# Patient Record
Sex: Male | Born: 1980 | Race: White | Hispanic: No | Marital: Single | Smoking: Current every day smoker
Health system: Southern US, Community
[De-identification: ages and names within clinical notes are randomized; demographics above are authoritative.]

## PROBLEM LIST (undated history)

## (undated) DIAGNOSIS — F319 Bipolar disorder, unspecified: Secondary | ICD-10-CM

## (undated) HISTORY — PX: HERNIA REPAIR: SHX51

---

## 2004-12-17 ENCOUNTER — Emergency Department: Payer: Self-pay | Admitting: Internal Medicine

## 2006-04-30 ENCOUNTER — Emergency Department: Payer: Self-pay | Admitting: Emergency Medicine

## 2015-08-27 ENCOUNTER — Emergency Department
Admission: EM | Admit: 2015-08-27 | Discharge: 2015-08-28 | Disposition: A | Discharge status: Other Acute Inpatient Hospital | Payer: Self-pay | Attending: Emergency Medicine | Admitting: Emergency Medicine

## 2015-08-27 ENCOUNTER — Encounter: Payer: Self-pay | Admitting: *Deleted

## 2015-08-27 DIAGNOSIS — F321 Major depressive disorder, single episode, moderate: Secondary | ICD-10-CM | POA: Insufficient documentation

## 2015-08-27 DIAGNOSIS — F17210 Nicotine dependence, cigarettes, uncomplicated: Secondary | ICD-10-CM | POA: Insufficient documentation

## 2015-08-27 DIAGNOSIS — F1410 Cocaine abuse, uncomplicated: Secondary | ICD-10-CM | POA: Insufficient documentation

## 2015-08-27 DIAGNOSIS — F1310 Sedative, hypnotic or anxiolytic abuse, uncomplicated: Secondary | ICD-10-CM | POA: Insufficient documentation

## 2015-08-27 LAB — CBC
HCT: 48.9 % (ref 40.0–52.0)
Hemoglobin: 16.7 g/dL (ref 13.0–18.0)
MCH: 31 pg (ref 26.0–34.0)
MCHC: 34.2 g/dL (ref 32.0–36.0)
MCV: 90.8 fL (ref 80.0–100.0)
Platelets: 302 10*3/uL (ref 150–440)
RBC: 5.39 MIL/uL (ref 4.40–5.90)
RDW: 12.9 % (ref 11.5–14.5)
WBC: 10.1 10*3/uL (ref 3.8–10.6)

## 2015-08-27 LAB — URINE DRUG SCREEN, QUALITATIVE (ARMC ONLY)
Amphetamines, Ur Screen: NOT DETECTED
BARBITURATES, UR SCREEN: NOT DETECTED
BENZODIAZEPINE, UR SCRN: POSITIVE — AB
Cannabinoid 50 Ng, Ur ~~LOC~~: NOT DETECTED
Cocaine Metabolite,Ur ~~LOC~~: POSITIVE — AB
MDMA (Ecstasy)Ur Screen: NOT DETECTED
METHADONE SCREEN, URINE: NOT DETECTED
Opiate, Ur Screen: NOT DETECTED
Phencyclidine (PCP) Ur S: NOT DETECTED
TRICYCLIC, UR SCREEN: NOT DETECTED

## 2015-08-27 LAB — ETHANOL: Alcohol, Ethyl (B): 31 mg/dL — ABNORMAL HIGH (ref <5)

## 2015-08-27 LAB — COMPREHENSIVE METABOLIC PANEL
ALK PHOS: 47 U/L (ref 38–126)
ALT: 15 U/L — ABNORMAL LOW (ref 17–63)
ANION GAP: 9 (ref 5–15)
AST: 21 U/L (ref 15–41)
Albumin: 4.4 g/dL (ref 3.5–5.0)
BILIRUBIN TOTAL: 0.5 mg/dL (ref 0.3–1.2)
BUN: 14 mg/dL (ref 6–20)
CALCIUM: 9.5 mg/dL (ref 8.9–10.3)
CO2: 27 mmol/L (ref 22–32)
Chloride: 105 mmol/L (ref 101–111)
Creatinine, Ser: 1.14 mg/dL (ref 0.61–1.24)
GFR calc non Af Amer: >60 mL/min (ref >60)
Glucose, Bld: 103 mg/dL — ABNORMAL HIGH (ref 65–99)
Potassium: 4.3 mmol/L (ref 3.5–5.1)
SODIUM: 141 mmol/L (ref 135–145)
TOTAL PROTEIN: 7.7 g/dL (ref 6.5–8.1)

## 2015-08-27 LAB — SALICYLATE LEVEL

## 2015-08-27 LAB — ACETAMINOPHEN LEVEL

## 2015-08-27 NOTE — ED Notes (Signed)
Patient girlfriend called - [phone] for patient while patient was sleeping and stated that she would like the patient to call and leave a voicemail with any updates about his care and progress.

## 2015-08-27 NOTE — ED Notes (Signed)
Report received from Kathaleen Grinderawn T., RN. Pt. Alert and oriented in no distress; SI; denies HI, AVH and pain.  Pt. Instructed to come to me with problems or concerns.Will continue to monitor for safety via security cameras and Q 15 minute checks.

## 2015-08-27 NOTE — ED Notes (Signed)
[facility]  Patient moved to ED BHU #4.

## 2015-08-27 NOTE — ED Notes (Signed)
Pt has seen psychiatrist.  Patient resting quietly in room. No noted distress or abnormal behaviors noted. Will continue 15 minute checks and observation by security camera for safety.

## 2015-08-27 NOTE — ED Notes (Signed)
Patient asleep in room. No noted distress or abnormal behavior. Will continue 15 minute checks and observation by security cameras for safety. 

## 2015-08-27 NOTE — ED Notes (Signed)
Patient resting quietly in room. No noted distress or abnormal behaviors noted. Will continue 15 minute checks and observation by security camera for safety. 

## 2015-08-27 NOTE — ED Notes (Signed)
Pt states a month ago he lost his job. Pt states he feels like he shouldn't be alive. Pt endorses hopelessness. Pt endorses plan to overdose on pills and has means to do so.

## 2015-08-27 NOTE — ED Notes (Signed)
BEHAVIORAL HEALTH ROUNDING Patient sleeping: Yes Patient alert and oriented: na Behavior appropriate: Yes.  ; If no, describe: Nutrition and fluids offered: na Toileting and hygiene offered: na  Sitter present: no Patent examinerLaw enforcement present: Yes  and ODS  ENVIRONMENTAL ASSESSMENT Potentially harmful objects out of patient reach: Yes.   Personal belongings secured: Yes.   Patient dressed in hospital provided attire only: Yes.   Plastic bags out of patient reach: Yes.   Patient care equipment (cords, cables, call bells, lines, and drains) shortened, removed, or accounted for: Yes.   Equipment and supplies removed from bottom of stretcher: Yes.   Potentially toxic materials out of patient reach: Yes.   Sharps container removed or out of patient reach: Yes.     ED BHU PLACEMENT JUSTIFICATION Is the patient under IVC or is there intent for IVC: No. Is the patient medically cleared: Yes.   Is there vacancy in the ED BHU: Yes.   Is the population mix appropriate for patient: Yes.   Is the patient awaiting placement in inpatient or outpatient setting: No. Has the patient had a psychiatric consult: Yes.   Survey of unit performed for contraband, proper placement and condition of furniture, tampering with fixtures in bathroom, shower, and each patient room: Yes.  ; Findings: all clear  APPEARANCE/BEHAVIOR calm, cooperative and adequate rapport can be established NEURO ASSESSMENT Orientation: time, place and person Hallucinations: No.None noted (Hallucinations) Speech: Normal Gait: normal RESPIRATORY ASSESSMENT Normal expansion.  Clear to auscultation.  No rales, rhonchi, or wheezing. CARDIOVASCULAR ASSESSMENT regular rate and rhythm, S1, S2 normal, no murmur, click, rub or gallop GASTROINTESTINAL ASSESSMENT soft, nontender, BS WNL, no r/g EXTREMITIES normal strength, tone, and muscle mass, no deformities, no erythema, induration, or nodules, no evidence of joint effusion, ROM of all  joints is normal, no evidence of joint instability PLAN OF CARE Provide calm/safe environment. Vital signs assessed twice daily. ED BHU Assessment once each 12-hour shift. Collaborate with intake RN daily or as condition indicates. Assure the ED provider has rounded once each shift. Provide and encourage hygiene. Provide redirection as needed. Assess for escalating behavior; address immediately and inform ED provider.  Assess family dynamic and appropriateness for visitation as needed: Yes.  ; If necessary, describe findings: na Educate the patient/family about BHU procedures/visitation: Yes.  ; If necessary, describe findings: na

## 2015-08-27 NOTE — ED Notes (Signed)
Pt given snack tray and drink 

## 2015-08-27 NOTE — ED Notes (Signed)
BEHAVIORAL HEALTH ROUNDING Patient sleeping: Yes.   Patient alert and oriented: not applicable Behavior appropriate: Yes.  ; If no, describe:   Nutrition and fluids offered: No Toileting and hygiene offered: No Sitter present: no Law enforcement present: Yes  and ODS    

## 2015-08-27 NOTE — Consult Note (Signed)
[name] Consult   Reason for Consult:  Follow up Referring Physician:  ER Patient Identification: Jackson Rodriguez MRN:  [id number] Principal Diagnosis: <principal problem not specified> Diagnosis:  There are no active problems to display for this patient.   Total Time spent with patient: 45 minutes  Subjective:   Jackson Rodriguez is a 34 y.o. male patient admitted with 2 month H/O Crack/Cocaine abuse and dependence and has been smoking .Marland Kitchen  HPI:  Pt decided to get help and quit drugs/ Pt lives with girl friend that works at a [employer] along with their 6 yr old child   Past Psychiatric History: No previous H/O Inpt to psychiatry. No H/O suicide attempts but did have thoughts/  Risk to Self: Suicidal Ideation: Yes-Currently Present Suicidal Intent: Yes-Currently Present Is patient at risk for suicide?: Yes Suicidal Plan?: Yes-Currently Present Specify Current Suicidal Plan: Pt planned to OD on drugs Access to Means: Yes Specify Access to Suicidal Means: Pt has access to drugs What has been your use of drugs/alcohol within the last 12 months?: crack cocaine How many times?: 0 Other Self Harm Risks: N/a Triggers for Past Attempts: None known Intentional Self Injurious Behavior: None Risk to Others: Homicidal Ideation: No Thoughts of Harm to Others: No Current Homicidal Intent: No Current Homicidal Plan: No Access to Homicidal Means: No Identified Victim: N/A History of harm to others?: No Assessment of Violence: None Noted Violent Behavior Description: N/A Does patient have access to weapons?: No Criminal Charges Pending?: No Does patient have a court date: No Prior Inpatient Therapy: Prior Inpatient Therapy: No Prior Therapy Dates: N/a Prior Therapy Facilty/Provider(s): N/A Reason for Treatment: N/a Prior Outpatient Therapy: Prior Outpatient Therapy: No Prior Therapy Dates: N/A Prior Therapy Facilty/Provider(s): N/A Reason for Treatment:  N/A Does patient have an ACCT team?: No Does patient have Intensive In-House Services?  : No Does patient have [facility] services? : No Does patient have P4CC services?: No  Past Medical History: History reviewed. No pertinent past medical history. History reviewed. No pertinent past surgical history. Family History: History reviewed. No pertinent family history. Family Psychiatric  History: none Social History:  History  Alcohol Use  . Yes    Comment: 2 - 40 OZ BEERS DAILY     History  Drug Use  . 7.00 per week  . Special: Cocaine    Comment: CRACK, last use w/i past 12 hrs    Social History   Social History  . Marital Status: Single    Spouse Name: N/A  . Number of Children: N/A  . Years of Education: N/A   Social History Main Topics  . Smoking status: Current Every Day Smoker    Types: Cigarettes  . Smokeless tobacco: Never Used  . Alcohol Use: Yes     Comment: 2 - 40 OZ BEERS DAILY  . Drug Use: 7.00 per week    Special: Cocaine     Comment: CRACK, last use w/i past 12 hrs  . Sexual Activity: No   Other Topics Concern  . None   Social History Narrative  . None   Additional Social History:    History of alcohol / drug use?: Yes Name of Substance 1: crack/cocaine 1 - Age of First Use: 34 1 - Amount (size/oz): $60 worth 1 - Frequency: every day 1 - Duration: all day 1 - Last Use / Amount: 08/26/2015  Allergies:  No Known Allergies  Labs:  Results for orders placed or performed during the hospital encounter of 08/27/15 (from the past 48 hour(s))  Comprehensive metabolic panel     Status: Abnormal   Collection Time: 08/27/15  4:07 AM  Result Value Ref Range   Sodium 141 135 - 145 mmol/L   Potassium 4.3 3.5 - 5.1 mmol/L   Chloride 105 101 - 111 mmol/L   CO2 27 22 - 32 mmol/L   Glucose, Bld 103 (H) 65 - 99 mg/dL   BUN 14 6 - 20 mg/dL   Creatinine, Ser 1.14 0.61 - 1.24 mg/dL   Calcium 9.5 8.9 - 10.3 mg/dL   Total Protein 7.7 6.5  - 8.1 g/dL   Albumin 4.4 3.5 - 5.0 g/dL   AST 21 15 - 41 U/L   ALT 15 (L) 17 - 63 U/L   Alkaline Phosphatase 47 38 - 126 U/L   Total Bilirubin 0.5 0.3 - 1.2 mg/dL   GFR calc non Af Amer >60 >60 mL/min   GFR calc Af Amer >60 >60 mL/min    Comment: (NOTE) The eGFR has been calculated using the CKD EPI equation. This calculation has not been validated in all clinical situations. eGFR's persistently <60 mL/min signify possible Chronic Kidney Disease.    Anion gap 9 5 - 15  Ethanol (ETOH)     Status: Abnormal   Collection Time: 08/27/15  4:07 AM  Result Value Ref Range   Alcohol, Ethyl (B) 31 (H) <5 mg/dL    Comment:        LOWEST DETECTABLE LIMIT FOR SERUM ALCOHOL IS 5 mg/dL FOR MEDICAL PURPOSES ONLY   Salicylate level     Status: None   Collection Time: 08/27/15  4:07 AM  Result Value Ref Range   Salicylate Lvl <2.7 2.8 - 30.0 mg/dL  Acetaminophen level     Status: Abnormal   Collection Time: 08/27/15  4:07 AM  Result Value Ref Range   Acetaminophen (Tylenol), Serum <10 (L) 10 - 30 ug/mL    Comment:        THERAPEUTIC CONCENTRATIONS VARY SIGNIFICANTLY. A RANGE OF 10-30 ug/mL MAY BE AN EFFECTIVE CONCENTRATION FOR MANY PATIENTS. HOWEVER, SOME ARE BEST TREATED AT CONCENTRATIONS OUTSIDE THIS RANGE. ACETAMINOPHEN CONCENTRATIONS >150 ug/mL AT 4 HOURS AFTER INGESTION AND >50 ug/mL AT 12 HOURS AFTER INGESTION ARE OFTEN ASSOCIATED WITH TOXIC REACTIONS.   CBC     Status: None   Collection Time: 08/27/15  4:07 AM  Result Value Ref Range   WBC 10.1 3.8 - 10.6 K/uL   RBC 5.39 4.40 - 5.90 MIL/uL   Hemoglobin 16.7 13.0 - 18.0 g/dL   HCT 48.9 40.0 - 52.0 %   MCV 90.8 80.0 - 100.0 fL   MCH 31.0 26.0 - 34.0 pg   MCHC 34.2 32.0 - 36.0 g/dL   RDW 12.9 11.5 - 14.5 %   Platelets 302 150 - 440 K/uL  Urine Drug Screen, Qualitative ([facility] only)     Status: Abnormal   Collection Time: 08/27/15  4:07 AM  Result Value Ref Range   Tricyclic, Ur Screen NONE DETECTED NONE DETECTED    Amphetamines, Ur Screen NONE DETECTED NONE DETECTED   MDMA (Ecstasy)Ur Screen NONE DETECTED NONE DETECTED   Cocaine Metabolite,Ur [place] POSITIVE (A) NONE DETECTED   Opiate, Ur Screen NONE DETECTED NONE DETECTED   Phencyclidine (PCP) Ur S NONE DETECTED NONE DETECTED   Cannabinoid 50 Ng, Ur [place] NONE DETECTED NONE DETECTED  Barbiturates, Ur Screen NONE DETECTED NONE DETECTED   Benzodiazepine, Ur Scrn POSITIVE (A) NONE DETECTED   Methadone Scn, Ur NONE DETECTED NONE DETECTED    Comment: (NOTE) 235  Tricyclics, urine               Cutoff 1000 ng/mL 200  Amphetamines, urine             Cutoff 1000 ng/mL 300  MDMA (Ecstasy), urine           Cutoff 500 ng/mL 400  Cocaine Metabolite, urine       Cutoff 300 ng/mL 500  Opiate, urine                   Cutoff 300 ng/mL 600  Phencyclidine (PCP), urine      Cutoff 25 ng/mL 700  Cannabinoid, urine              Cutoff 50 ng/mL 800  Barbiturates, urine             Cutoff 200 ng/mL 900  Benzodiazepine, urine           Cutoff 200 ng/mL 1000 Methadone, urine                Cutoff 300 ng/mL 1100 1200 The urine drug screen provides only a preliminary, unconfirmed 1300 analytical test result and should not be used for non-medical 1400 purposes. Clinical consideration and professional judgment should 1500 be applied to any positive drug screen result due to possible 1600 interfering substances. A more specific alternate chemical method 1700 must be used in order to obtain a confirmed analytical result.  1800 Gas chromato graphy / mass spectrometry (GC/MS) is the preferred 1900 confirmatory method.     No current facility-administered medications for this encounter.   No current outpatient prescriptions on file.    Musculoskeletal: Strength & Muscle Tone: within normal limits Gait & Station: normal Patient leans: N/A  Psychiatric Specialty Exam: Review of Systems  Constitutional: Negative.   HENT: Negative.   Eyes: Negative.   Respiratory:  Negative.   Cardiovascular: Negative.   Gastrointestinal: Negative.   Genitourinary: Negative.   Musculoskeletal: Negative.   Skin: Negative.   Neurological: Negative.   Endo/Heme/Allergies: Negative.   Psychiatric/Behavioral: Positive for depression and substance abuse. The patient is nervous/anxious.   All other systems reviewed and are negative.   Blood pressure 109/65, pulse 63, temperature 97.8 F (36.6 C), temperature source Oral, resp. rate 18, height $RemoveBe'6\' 1"'pnJUhodkV$  (1.854 m), weight 175 lb (79.379 kg), SpO2 98 %.Body mass index is 23.09 kg/(m^2).  General Appearance: Casual  Eye Contact::  Fair  Speech:  Clear and Coherent  Volume:  Normal  Mood:  Anxious  Affect:  Appropriate  Thought Process:  Circumstantial  Orientation:  Full (Time, Place, and Person)  Thought Content:  NA  Suicidal Thoughts:  Yes.  without intent/plan  Homicidal Thoughts:  No  Memory:  Immediate;   Fair Recent;   Fair Remote;   Fair adequate  Judgement:  Fair  Insight:  Fair  Psychomotor Activity:  Normal  Concentration:  Fair  Recall:  [employer] [place]  Language: Fair  Akathisia:  No  Handed:  Right  AIMS (if indicated):     Assets:  Communication Skills Desire for [place] Talents/Skills  ADL's:  Intact  Cognition: WNL  Sleep:      Treatment Plan Summary: Plan Will continue observation for withdrawl and will consider D/C tomorrow evening with  follow up with [facility] for Out pt substance abuse program on Monday 08/29/2015  Disposition: No evidence of imminent risk to self or others at present.    Dewain Penning 08/27/2015 4:27 PM

## 2015-08-27 NOTE — ED Notes (Signed)
Pt. To ED-BHU from ED ambulatory without difficulty, to room  . Report from RN. Pt. Is alert and oriented, warm and dry in no distress. Pt. Verbalizes having SI; denies having HI, and AVH. Pt. Calm and cooperative. Pt. States, "I gotta stop; the drugs are out of hand I spent up all of the money; Christmas money; all of it." Pt. Made aware of security cameras and Q15 minute rounds. Pt. Encouraged to let Nursing staff know of any concerns or needs.

## 2015-08-27 NOTE — ED Notes (Signed)
Patient aware that he will be discharged tomorrow.  Patient resting quietly in room. No noted distress or abnormal behaviors noted. Will continue 15 minute checks and observation by security camera for safety.

## 2015-08-27 NOTE — ED Notes (Signed)
Patient resting quietly in room. No noted distress or abnormal behaviors noted. Will continue 15 minute checks and observation by security camera for safety. 

## 2015-08-27 NOTE — ED Notes (Signed)
Pt. Noted in room resting quietly;. No complaints or concerns voiced. No distress or abnormal behavior noted. Will continue to monitor with security cameras. Q 15 minute rounds continue. 

## 2015-08-27 NOTE — ED Notes (Signed)
BEHAVIORAL HEALTH ROUNDING Patient sleeping: No. Patient alert and oriented: yes Behavior appropriate: Yes.  ; If no, describe: tv on Nutrition and fluids offered: Yes  Toileting and hygiene offered: Yes  Sitter present: no Law enforcement present: Yes  and ODS

## 2015-08-27 NOTE — ED Notes (Signed)
BEHAVIORAL HEALTH ROUNDING Patient sleeping: No. Patient alert and oriented: yes Behavior appropriate: Yes.  ; If no, describe: watching tv Nutrition and fluids offered: Yes  Toileting and hygiene offered: Yes  Sitter present: no Law enforcement present: Yes  and ODS

## 2015-08-27 NOTE — ED Provider Notes (Signed)
[facility] Emergency Department Provider Note  ____________________________________________  Time seen: Approximately 4:51 AM  I have reviewed the triage vital signs and the nursing notes.   HISTORY  Chief Complaint Suicidal    HPI Catalina LungerBrandon D Porche is a 34 y.o. male who presents to the ED from home accompanied by his wife with a chief complaint of depression. Patient states he lost his job over 1 month ago and has been drinking 240 ounce beers daily since that time. States he feels helpless and feels like he should not be alive area and voices suicidal ideation with plan to overdose on pills. Voices no medical complaints.   Past Medical History None  There are no active problems to display for this patient.   History reviewed. No pertinent past surgical history.  No current outpatient prescriptions on file.  Allergies Review of patient's allergies indicates no known allergies.  History reviewed. No pertinent family history.  Social History Social History  Substance Use Topics  . Smoking status: Current Every Day Smoker    Types: Cigarettes  . Smokeless tobacco: Never Used  . Alcohol Use: Yes     Comment: 2 - 40 OZ BEERS DAILY    Review of Systems Constitutional: No fever/chills Eyes: No visual changes. ENT: No sore throat. Cardiovascular: Denies chest pain. Respiratory: Denies shortness of breath. Gastrointestinal: No abdominal pain.  No nausea, no vomiting.  No diarrhea.  No constipation. Genitourinary: Negative for dysuria. Musculoskeletal: Negative for back pain. Skin: Negative for rash. Neurological: Negative for headaches, focal weakness or numbness. Psychiatric:Positive for depression with suicidal ideation.  10-point ROS otherwise negative.  ____________________________________________   PHYSICAL EXAM:  VITAL SIGNS: ED Triage Vitals  Enc Vitals Group     BP 08/27/15 0355 125/77 mmHg     Pulse Rate 08/27/15 0355 106      Resp 08/27/15 0355 20     Temp 08/27/15 0355 98.1 F (36.7 C)     Temp Source 08/27/15 0355 Oral     SpO2 08/27/15 0355 96 %     Weight 08/27/15 0355 175 lb (79.379 kg)     Height 08/27/15 0355 6\' 1"  (1.854 m)     Head Cir --      Peak Flow --      Pain Score --      Pain Loc --      Pain Edu? --      Excl. in GC? --     Constitutional: Alert and oriented. Well appearing and in no acute distress. Eyes: Conjunctivae are normal. PERRL. EOMI. Head: Atraumatic. Nose: No congestion/rhinnorhea. Mouth/Throat: Mucous membranes are moist.  Oropharynx non-erythematous. Neck: No stridor.   Cardiovascular: Normal rate, regular rhythm. Grossly normal heart sounds.  Good peripheral circulation. Respiratory: Normal respiratory effort.  No retractions. Lungs CTAB. Gastrointestinal: Soft and nontender. No distention. No abdominal bruits. No CVA tenderness. Musculoskeletal: No lower extremity tenderness nor edema.  No joint effusions. Neurologic:  Normal speech and language. No gross focal neurologic deficits are appreciated. No gait instability. Skin:  Skin is warm, dry and intact. No rash noted. Psychiatric: Mood and affect are flat. Speech and behavior are flat.  ____________________________________________   LABS (all labs ordered are listed, but only abnormal results are displayed)  Labs Reviewed  COMPREHENSIVE METABOLIC PANEL - Abnormal; Notable for the following:    Glucose, Bld 103 (*)    ALT 15 (*)    All other components within normal limits  ETHANOL - Abnormal; Notable for  the following:    Alcohol, Ethyl (B) 31 (*)    All other components within normal limits  ACETAMINOPHEN LEVEL - Abnormal; Notable for the following:    Acetaminophen (Tylenol), Serum <10 (*)    All other components within normal limits  URINE DRUG SCREEN, QUALITATIVE ([facility] ONLY) - Abnormal; Notable for the following:    Cocaine Metabolite,Ur [place] POSITIVE (*)    Benzodiazepine, Ur Scrn POSITIVE (*)     All other components within normal limits  SALICYLATE LEVEL  CBC   ____________________________________________  EKG  None ____________________________________________  RADIOLOGY  None ____________________________________________   PROCEDURES  Procedure(s) performed: None  Critical Care performed: No  ____________________________________________   INITIAL IMPRESSION / ASSESSMENT AND PLAN / ED COURSE  Pertinent labs & imaging results that were available during my care of the patient were reviewed by me and considered in my medical decision making (see chart for details).  34 year old male who presents with depression and suicidal ideation. Also admits he has started to use cocaine to deal with his hopelessness. Given the severity of patient's depressive symptoms, will place under involuntary commitment for his safety. TTS has evaluated and will hold for psychiatry consult in the morning. Patient is medically cleared at this time for psychiatric disposition. ____________________________________________   FINAL CLINICAL IMPRESSION(S) / ED DIAGNOSES  Final diagnoses:  Moderate single current episode of major depressive disorder (HCC)  Cocaine abuse      Irean Hong, MD 08/27/15 [phone]

## 2015-08-27 NOTE — ED Notes (Signed)
Patient given breakfast tray.

## 2015-08-27 NOTE — Progress Notes (Signed)
Per request of Dr. Guss Bundehalla , writer provided the pt. with information and instructions on how to access Outpatient Mental Health & Substance Abuse Treatment ([facility] and [facility]).   08/27/2015 Cheryl FlashNicole Faelyn Sigler, MS, NCC, LPCA Therapeutic Triage Specialist

## 2015-08-27 NOTE — ED Notes (Signed)
Patient received dinner tray 

## 2015-08-27 NOTE — ED Notes (Signed)
[facility] 

## 2015-08-27 NOTE — ED Notes (Signed)
Patient received lunch tray 

## 2015-08-27 NOTE — BH Assessment (Signed)
Assessment Note  Jackson Rodriguez is an 34 y.o. male presenting to the ED with complaints of drug use, depression and suicidal ideations with a plan to overdose on drugs.  Pt reports he lost his job about a month ago and feels hopeless and helpless because he is not able to provide for his family.  Pt reports he was recently released from prison and feels as though he has been struggling since that time.  He states he drinks 240 ounces of beer daily and just recently started using crack.  Pt states he regrets making the decision to use drugs.  He states he almost sold some his Playstation game to buy drugs which made him realize he needs help with his addiction.   Diagnosis: Suicidal/Substance Use  Past Medical History: History reviewed. No pertinent past medical history.  History reviewed. No pertinent past surgical history.  Family History: History reviewed. No pertinent family history.  Social History:  reports that he has been smoking Cigarettes.  He has never used smokeless tobacco. He reports that he drinks alcohol. He reports that he uses illicit drugs (Cocaine) about 7 times per week.  Additional Social History:  Alcohol / Drug Use History of alcohol / drug use?: Yes Substance #1 Name of Substance 1: crack/cocaine 1 - Age of First Use: 34 1 - Amount (size/oz): $60 worth 1 - Frequency: every day 1 - Duration: all day 1 - Last Use / Amount: 08/26/2015  CIWA: CIWA-Ar BP: 125/77 mmHg Pulse Rate: (!) 106 COWS:    Allergies: No Known Allergies  Home Medications:  (Not in a hospital admission)  OB/GYN Status:  No LMP for male patient.  General Assessment Data Location of Assessment: [facility] ED TTS Assessment: In system Is this a Tele or Face-to-Face Assessment?: Face-to-Face Is this an Initial Assessment or a Re-assessment for this encounter?: Initial Assessment Marital status: Married Stanton name: N/a Is patient pregnant?: No Pregnancy Status: No Living Arrangements:  Spouse/significant other Can pt return to current living arrangement?: Yes Admission Status: Voluntary Is patient capable of signing voluntary admission?: Yes Referral Source: Self/Family/Friend Insurance type: None  Medical Screening Exam [facility] Walk-in ONLY) Medical Exam completed: Yes  Crisis Care Plan Living Arrangements: Spouse/significant other Legal Guardian: Other: (self) Name of Psychiatrist: None Name of Therapist: None  Education Status Is patient currently in school?: No Current Grade: N/A Highest grade of school patient has completed: 12th Name of school: N/a Contact person: N/A  Risk to self with the past 6 months Suicidal Ideation: Yes-Currently Present Has patient been a risk to self within the past 6 months prior to admission? : No Suicidal Intent: Yes-Currently Present Has patient had any suicidal intent within the past 6 months prior to admission? : No Is patient at risk for suicide?: Yes Suicidal Plan?: Yes-Currently Present Has patient had any suicidal plan within the past 6 months prior to admission? : No Specify Current Suicidal Plan: Pt planned to OD on drugs Access to Means: Yes Specify Access to Suicidal Means: Pt has access to drugs What has been your use of drugs/alcohol within the last 12 months?: crack cocaine Previous Attempts/Gestures: No How many times?: 0 Other Self Harm Risks: N/a Triggers for Past Attempts: None known Intentional Self Injurious Behavior: None Family Suicide History: No Recent stressful life event(s): Job Loss Persecutory voices/beliefs?: No Depression: Yes Depression Symptoms: Isolating, Loss of interest in usual pleasures, Feeling worthless/self pity, Guilt, Insomnia Substance abuse history and/or treatment for substance abuse?: Yes Suicide prevention information  given to non-admitted patients: Not applicable  Risk to Others within the past 6 months Homicidal Ideation: No Does patient have any lifetime risk of  violence toward others beyond the six months prior to admission? : No Thoughts of Harm to Others: No Current Homicidal Intent: No Current Homicidal Plan: No Access to Homicidal Means: No Identified Victim: N/A History of harm to others?: No Assessment of Violence: None Noted Violent Behavior Description: N/A Does patient have access to weapons?: No Criminal Charges Pending?: No Does patient have a court date: No Is patient on probation?: No  Psychosis Hallucinations: None noted Delusions: None noted  Mental Status Report Appearance/Hygiene: In scrubs Eye Contact: Good Motor Activity: Freedom of movement Speech: Logical/coherent Level of Consciousness: Alert Mood: Depressed, Sad, Despair Affect: Depressed, Sad Anxiety Level: Moderate Thought Processes: Coherent Judgement: Partial Orientation: Person, Place, Time, Situation Obsessive Compulsive Thoughts/Behaviors: None  Cognitive Functioning Concentration: Normal Memory: Recent Intact, Remote Intact IQ: Average Insight: Fair Impulse Control: Fair Appetite: Fair Weight Loss: 0 Weight Gain: 0 Sleep: Decreased Total Hours of Sleep: 3 Vegetative Symptoms: None  ADLScreening [facility] Assessment Services) Patient's cognitive ability adequate to safely complete daily activities?: Yes Patient able to express need for assistance with ADLs?: Yes Independently performs ADLs?: Yes (appropriate for developmental age)  Prior Inpatient Therapy Prior Inpatient Therapy: No Prior Therapy Dates: N/a Prior Therapy Facilty/Provider(s): N/A Reason for Treatment: N/a  Prior Outpatient Therapy Prior Outpatient Therapy: No Prior Therapy Dates: N/A Prior Therapy Facilty/Provider(s): N/A Reason for Treatment: N/A Does patient have an ACCT team?: No Does patient have Intensive In-House Services?  : No Does patient have [facility] services? : No Does patient have P4CC services?: No  ADL Screening (condition at time of  admission) Patient's cognitive ability adequate to safely complete daily activities?: Yes Patient able to express need for assistance with ADLs?: Yes Independently performs ADLs?: Yes (appropriate for developmental age)       Abuse/Neglect Assessment (Assessment to be complete while patient is alone) Physical Abuse: Denies Verbal Abuse: Denies Sexual Abuse: Denies Exploitation of patient/patient's resources: Denies Self-Neglect: Denies Values / Beliefs Cultural Requests During Hospitalization: None Spiritual Requests During Hospitalization: None Consults Spiritual Care Consult Needed: No Social Work Consult Needed: No Merchant navy officerAdvance Directives (For Healthcare) Does patient have an advance directive?: No Would patient like information on creating an advanced directive?: Yes English as a second language teacher- Educational materials given    Additional Information 1:1 In Past 12 Months?: No CIRT Risk: No Elopement Risk: No Does patient have medical clearance?: Yes     Disposition:  Disposition Initial Assessment Completed for this Encounter: Yes Disposition of Patient: Other dispositions Other disposition(s): Other (Comment) (Psych MD consult)  On Site Evaluation by:   Reviewed with Physician:    Artist Beachoxana C Dominque Marlin 08/27/2015 6:32 AM

## 2015-08-27 NOTE — ED Notes (Signed)
Pt is eating lunch. No noted distress or abnormal behaviors noted. Will continue 15 minute checks and observation by security camera for safety.

## 2015-08-27 NOTE — ED Notes (Signed)
Pt taking a shower. No noted distress or abnormal behaviors noted. Will continue 15 minute checks and observation by security camera for safety.

## 2015-08-28 MED ORDER — DIPHENHYDRAMINE HCL 25 MG PO CAPS
ORAL_CAPSULE | ORAL | Status: AC
  Administered 2015-08-28: 50 mg via ORAL
  Filled 2015-08-28: qty 2

## 2015-08-28 MED ORDER — DIPHENHYDRAMINE HCL 25 MG PO CAPS
50.0000 mg | ORAL_CAPSULE | Freq: Once | ORAL | Status: AC
  Administered 2015-08-28: 50 mg via ORAL

## 2015-08-28 NOTE — ED Notes (Signed)
Patient in room watching television and remains calm and cooperative. Maintained on 15 minute checks and observation by security camera for safety.

## 2015-08-28 NOTE — ED Notes (Signed)
Patient currently in the dayroom speaking with girlfriend. Nurse then spoke to girlfriend who asked about possible discharge time due to work commitments and nurse informed her and patient that he could leave pending the discontinuation of his IVC.

## 2015-08-28 NOTE — ED Provider Notes (Signed)
Patient has been cleared by Dr. Guss Bundehalla for discharge. Patient is currently medically stable.  Jackson FilbertJonathan E Shaida Route, MD 08/28/15 [phone]

## 2015-08-28 NOTE — ED Notes (Signed)
Patient asleep in room. No noted distress or abnormal behavior. Will continue 15 minute checks and observation by security cameras for safety. 

## 2015-08-28 NOTE — BH Assessment (Signed)
Writer spoke with patient to get an update on his current mental and emotional status.  He states, he was doing well and was ready to go home. He reported, he came to the ER to get help for his substance and alcohol use.  Patient currently denies SI/HI and AV/H.

## 2015-08-28 NOTE — ED Notes (Signed)
Pt request med to help sleep, reports sleeping during the day today, Dr York CeriseForbach consulted

## 2015-08-28 NOTE — ED Notes (Signed)
BEHAVIORAL HEALTH ROUNDING Patient sleeping: Yes.   Patient alert and oriented: not applicable Behavior appropriate: Yes.  ; If no, describe: sleeping Nutrition and fluids offered: No Toileting and hygiene offered: No Sitter present: no Law enforcement present: Yes  and ODS  

## 2015-08-28 NOTE — ED Notes (Signed)
ENVIRONMENTAL ASSESSMENT Potentially harmful objects out of patient reach: Yes Personal belongings secured: Yes Patient dressed in hospital provided attire only: Yes Plastic bags out of patient reach: Yes Patient care equipment (cords, cables, call bells, lines, and drains) shortened, removed, or accounted for: Yes Equipment and supplies removed from bottom of stretcher: Yes Potentially toxic materials out of patient reach: Yes Sharps container removed or out of patient reach: Yes  Patient sleeping. No signs of distress noted. Maintained on 15 minute checks and observation by security camera for safety.

## 2015-08-28 NOTE — ED Notes (Signed)
Patient resting quietly in room. No noted distress or abnormal behaviors noted. Will continue 15 minute checks and observation by security camera for safety. 

## 2015-08-28 NOTE — Consult Note (Signed)
  S Pt is a 34 yr old white male with a 2 month H/O Crack/cocaine dependence and came for help with the same.  He is going through withdrawal and wants to get help for the same . O. Alert and ox3 Calm pleasant and co-operative. No agitation. Affect is neutral and mood is stable and denies feeling depressed. No psychosis. Cognition is intact. Denies s/h ideas or plans and contracts for safety and is eager to go home and get help For his Substance abuse problems from appropriate program. I/ J fair and adequate. Impulse control is good. Imp Crack Cocaine dependence for 2 months. Substance Induced mood disorder with SI resolved. Plan. D/C IVC and Discharge pt home and pt will keep follow up with [facility] in AM of 08/29/2015 at their Substance abuse clinic.

## 2015-08-28 NOTE — Discharge Instructions (Signed)
Stimulant Use Disorder-Cocaine  Cocaine is one of a group of powerful drugs called stimulants. Cocaine has medical uses for stopping nosebleeds and for pain control before minor nose or dental surgery. However, cocaine is misused because of the effects that it produces. These effects include:   · A feeling of extreme pleasure.  · Alertness.  · High energy.  Common street names for cocaine include coke, crack, blow, snow, and nose candy. Cocaine is snorted, dissolved in water and injected, or smoked.   Stimulants are addictive because they activate regions of the brain that produce both the pleasurable sensation of "reward" and psychological dependence. Together, these actions account for loss of control and the rapid development of drug dependence. This means you become ill without the drug (withdrawal) and need to keep using it to function.   Stimulant use disorder is use of stimulants that disrupts your daily life. It disrupts relationships with family and friends and how you do your job. Cocaine increases your blood pressure and heart rate. It can cause a heart attack or stroke. Cocaine can also cause death from irregular heart rate or seizures.  SYMPTOMS  Symptoms of stimulant use disorder with cocaine include:  · Use of cocaine in larger amounts or over a longer period of time than intended.  · Unsuccessful attempts to cut down or control cocaine use.  · A lot of time spent obtaining, using, or recovering from the effects of cocaine.  · A strong desire or urge to use cocaine (craving).  · Continued use of cocaine in spite of major problems at work, school, or home because of use.  · Continued use of cocaine in spite of relationship problems because of use.  · Giving up or cutting down on important life activities because of cocaine use.  · Use of cocaine over and over in situations when it is physically hazardous, such as driving a car.  · Continued use of cocaine in spite of a physical problem that is likely  related to use. Physical problems can include:  ¨ Malnutrition.  ¨ Nosebleeds.  ¨ Chest pain.  ¨ High blood pressure.  ¨ A hole that develops between the part of your nose that separates your nostrils (perforated nasal septum).  ¨ Lung and kidney damage.  · Continued use of cocaine in spite of a mental problem that is likely related to use. Mental problems can include:  ¨ Schizophrenia-like symptoms.  ¨ Depression.  ¨ Bipolar mood swings.  ¨ Anxiety.  ¨ Sleep problems.  · Need to use more and more cocaine to get the same effect, or lessened effect over time with use of the same amount of cocaine (tolerance).  · Having withdrawal symptoms when cocaine use is stopped, or using cocaine to reduce or avoid withdrawal symptoms. Withdrawal symptoms include:  ¨ Depressed or irritable mood.  ¨ Low energy or restlessness.  ¨ Bad dreams.  ¨ Poor or excessive sleep.  ¨ Increased appetite.  DIAGNOSIS  Stimulant use disorder is diagnosed by your health care provider. You may be asked questions about your cocaine use and how it affects your life. A physical exam may be done. A drug screen may be ordered. You may be referred to a mental health professional. The diagnosis of stimulant use disorder requires at least two symptoms within 12 months. The type of stimulant use disorder depends on the number of signs and symptoms you have. The type may be:  · Mild. Two or three signs and symptoms.  ·   Moderate. Four or five signs and symptoms.  · Severe. Six or more signs and symptoms.  TREATMENT  Treatment for stimulant use disorder is usually provided by mental health professionals with training in substance use disorders. The following options are available:  · Counseling or talk therapy. Talk therapy addresses the reasons you use cocaine and ways to keep you from using again. Goals of talk therapy include:  ¨ Identifying and avoiding triggers for use.  ¨ Handling cravings.  ¨ Replacing use with healthy activities.  · Support groups.  Support groups provide emotional support, advice, and guidance.  · Medicine. Certain medicines may decrease cocaine cravings or withdrawal symptoms.  HOME CARE INSTRUCTIONS  · Take medicines only as directed by your health care provider.  · Identify the people and activities that trigger your cocaine use and avoid them.  · Keep all follow-up visits as directed by your health care provider.  SEEK MEDICAL CARE IF:  · Your symptoms get worse or you relapse.  · You are not able to take medicines as directed.  SEEK IMMEDIATE MEDICAL CARE IF:  · You have serious thoughts about hurting yourself or others.  · You have a seizure, chest pain, sudden weakness, or loss of speech or vision.  FOR MORE INFORMATION  · National Institute on Drug Abuse: www.drugabuse.gov  · Substance Abuse and Mental Health Services Administration: www.samhsa.gov     This information is not intended to replace advice given to you by your health care provider. Make sure you discuss any questions you have with your health care provider.     Document Released: 08/31/2000 Document Revised: 09/24/2014 Document Reviewed: 09/16/2013  Elsevier Interactive Patient Education ©2016 Elsevier Inc.

## 2015-08-28 NOTE — ED Provider Notes (Signed)
-----------------------------------------   4:13 AM on 08/28/2015 -----------------------------------------   Blood pressure 123/71, pulse 77, temperature 98.2 F (36.8 C), temperature source Oral, resp. rate 18, height 6\' 1"  (1.854 m), weight 79.379 kg, SpO2 99 %.  The patient had no acute events since last update.  He was requesting medication to help him sleep earlier tonight so he was given some Benadryl.  He is reportedly awaiting placement in a group home and discharge is anticipated later today.   Loleta Roseory Taysha Majewski, MD 08/28/15 [phone]

## 2015-08-28 NOTE — ED Notes (Signed)
Patient currently denies SI/HI/AVH and pain. All belongings returned to patient. Patient discharged to home to girlfriend.

## 2015-08-28 NOTE — ED Notes (Addendum)
Patient resting quietly in room. No noted distress or abnormal behaviors noted. Will continue 15 minute checks and observation by security camera for safety. 

## 2015-10-25 ENCOUNTER — Emergency Department
Admission: EM | Admit: 2015-10-25 | Discharge: 2015-10-25 | Disposition: A | Discharge status: Home / Self Care | Payer: Self-pay | Attending: Emergency Medicine | Admitting: Emergency Medicine

## 2015-10-25 ENCOUNTER — Encounter: Payer: Self-pay | Admitting: Emergency Medicine

## 2015-10-25 DIAGNOSIS — L729 Follicular cyst of the skin and subcutaneous tissue, unspecified: Secondary | ICD-10-CM | POA: Insufficient documentation

## 2015-10-25 DIAGNOSIS — F17210 Nicotine dependence, cigarettes, uncomplicated: Secondary | ICD-10-CM | POA: Insufficient documentation

## 2015-10-25 DIAGNOSIS — L720 Epidermal cyst: Secondary | ICD-10-CM

## 2015-10-25 MED ORDER — HYDROCODONE-ACETAMINOPHEN 5-325 MG PO TABS: 1.0000 | ORAL_TABLET | ORAL | Status: DC | PRN

## 2015-10-25 MED ORDER — SULFAMETHOXAZOLE-TRIMETHOPRIM 800-160 MG PO TABS: 1.0000 | ORAL_TABLET | Freq: Two times a day (BID) | ORAL | Status: DC

## 2015-10-25 MED ORDER — IBUPROFEN 800 MG PO TABS: 800.0000 mg | ORAL_TABLET | Freq: Three times a day (TID) | ORAL | Status: DC | PRN

## 2015-10-25 NOTE — ED Notes (Signed)
States he developed some swollen areas to both arms  Left side of neck and groin for several weeks. No fever or drainage

## 2015-10-25 NOTE — Discharge Instructions (Signed)
Epidermal Cyst An epidermal cyst is sometimes called a sebaceous cyst, epidermal inclusion cyst, or infundibular cyst. These cysts usually contain a substance that looks "pasty" or "cheesy" and may have a bad smell. This substance is a protein called keratin. Epidermal cysts are usually found on the face, neck, or trunk. They may also occur in the vaginal area or other parts of the genitalia of both men and women. Epidermal cysts are usually small, painless, slow-growing bumps or lumps that move freely under the skin. It is important not to try to pop them. This may cause an infection and lead to tenderness and swelling. CAUSES  Epidermal cysts may be caused by a deep penetrating injury to the skin or a plugged hair follicle, often associated with acne. SYMPTOMS  Epidermal cysts can become inflamed and cause:  Redness.  Tenderness.  Increased temperature of the skin over the bumps or lumps.  Grayish-white, bad smelling material that drains from the bump or lump. DIAGNOSIS  Epidermal cysts are easily diagnosed by your caregiver during an exam. Rarely, a tissue sample (biopsy) may be taken to rule out other conditions that may resemble epidermal cysts. TREATMENT   Epidermal cysts often get better and disappear on their own. They are rarely ever cancerous.  If a cyst becomes infected, it may become inflamed and tender. This may require opening and draining the cyst. Treatment with antibiotics may be necessary. When the infection is gone, the cyst may be removed with minor surgery.  Small, inflamed cysts can often be treated with antibiotics or by injecting steroid medicines.  Sometimes, epidermal cysts become large and bothersome. If this happens, surgical removal in your caregiver's office may be necessary. HOME CARE INSTRUCTIONS  Only take over-the-counter or prescription medicines as directed by your caregiver.  Take your antibiotics as directed. Finish them even if you start to feel  better. SEEK MEDICAL CARE IF:   Your cyst becomes tender, red, or swollen.  Your condition is not improving or is getting worse.  You have any other questions or concerns. MAKE SURE YOU:  Understand these instructions.  Will watch your condition.  Will get help right away if you are not doing well or get worse.   This information is not intended to replace advice given to you by your health care provider. Make sure you discuss any questions you have with your health care provider.   Document Released: 08/04/2004 Document Revised: 11/26/2011 Document Reviewed: 03/12/2011 Elsevier Interactive Patient Education 2016 Elsevier Inc.  Sebaceous Cyst Removal Sebaceous cyst removal is a procedure to remove a sac of oily material that forms under your skin (sebaceous cyst). Sebaceous cysts may also be called epidermoid cysts or keratin cysts. Normally, the skin secretes this oily material through a gland or a hair follicle. This type of cyst usually results when a skin gland or hair follicle becomes blocked. You may need this procedure if you have a sebaceous cyst that becomes large, uncomfortable, or infected. LET YOUR HEALTH CARE PROVIDER KNOW ABOUT:  Any allergies you have.  All medicines you are taking, including vitamins, herbs, eye drops, creams, and over-the-counter medicines.  Previous problems you or members of your family have had with the use of anesthetics.  Any blood disorders you have.  Previous surgeries you have had.  Medical conditions you have. RISKS AND COMPLICATIONS Generally, this is a safe procedure. However, problems may occur, including:  Developing another cyst.  Bleeding.  Infection.  Scarring. BEFORE THE PROCEDURE  Ask your health   care provider about:  Changing or stopping your regular medicines. This is especially important if you are taking diabetes medicines or blood thinners.  Taking medicines such as aspirin and ibuprofen. These medicines  can thin your blood. Do not take these medicines before your procedure if your health care provider instructs you not to.  If you have an infected cyst, you may have to take antibiotic medicines before or after the cyst removal. Take your antibiotics as directed by your health care provider. Finish all of the medicine even if you start to feel better.  Take a shower on the morning of your procedure. Your health care provider may ask you to use a germ-killing (antiseptic) soap. PROCEDURE  You will be given a medicine that numbs the area (local anesthetic).  The skin around the cyst will be cleaned with a germ-killing solution (antiseptic).  Your health care provider will make a small surgical incision over the cyst.  The cyst will be separated from the surrounding tissues that are under your skin.  If possible, the cyst will be removed undamaged (intact).  If the cyst bursts (ruptures), it will need to be removed in pieces.  After the cyst is removed, your health care provider will control any bleeding and close the incision with small stitches (sutures). Small incisions may not need sutures, and the bleeding will be controlled by applying direct pressure with gauze.  Your health care provider may apply antibiotic ointment and a light bandage (dressing) over the incision. This procedure may vary among health care providers and hospitals. AFTER THE PROCEDURE  If your cyst ruptured during surgery, you may need to take antibiotic medicine. If you were prescribed an antibiotic medicine, finish all of it even if you start to feel better.   This information is not intended to replace advice given to you by your health care provider. Make sure you discuss any questions you have with your health care provider.   Document Released: 08/31/2000 Document Revised: 09/24/2014 Document Reviewed: 05/19/2014 Elsevier Interactive Patient Education 2016 Elsevier Inc.   

## 2015-10-25 NOTE — ED Notes (Signed)
Patient with "knots" to jaw line, bilateral arms, ribs. Denies any drainage. States he has had several areas in last 3-4 years, two new areas to left jaw line. Denies any fevers.

## 2015-10-25 NOTE — ED Provider Notes (Signed)
[facility] Emergency Department Provider Note  ____________________________________________  Time seen: Approximately 2:13 PM  I have reviewed the triage vital signs and the nursing notes.   HISTORY  Chief Complaint Abscess    HPI Jackson Rodriguez is a 35 y.o. male presents for evaluation of knots to his jaw posterior left ear bilateral arms ribs groin. States that these knots have been there for 3-4 years. Concerned about the new onset of the knot behind his ear and on his jaw. This has been in the last 5 days.   History reviewed. No pertinent past medical history.  There are no active problems to display for this patient.   History reviewed. No pertinent past surgical history.  Current Outpatient Rx  Name  Route  Sig  Dispense  Refill  . HYDROcodone-acetaminophen (NORCO) 5-325 MG tablet   Oral   Take 1-2 tablets by mouth every 4 (four) hours as needed for moderate pain.   15 tablet   0   . ibuprofen (ADVIL,MOTRIN) 800 MG tablet   Oral   Take 1 tablet (800 mg total) by mouth every 8 (eight) hours as needed.   30 tablet   0   . sulfamethoxazole-trimethoprim (BACTRIM DS,SEPTRA DS) 800-160 MG tablet   Oral   Take 1 tablet by mouth 2 (two) times daily.   20 tablet   0     Allergies Review of patient's allergies indicates no known allergies.  No family history on file.  Social History Social History  Substance Use Topics  . Smoking status: Current Every Day Smoker    Types: Cigarettes  . Smokeless tobacco: Never Used  . Alcohol Use: Yes     Comment: 2 - 40 OZ [place] Givler DAILY    Review of Systems Constitutional: No fever/chills Eyes: No visual changes. ENT: No sore throat. Cardiovascular: Denies chest pain. Respiratory: Denies shortness of breath. Gastrointestinal: No abdominal pain.  No nausea, no vomiting.  No diarrhea.  No constipation. Genitourinary: Negative for dysuria. Musculoskeletal: Negative for back pain. Skin:  Positive for multiple subcutaneous skin lesions. Neurological: Negative for headaches, focal weakness or numbness.  10-point ROS otherwise negative.  ____________________________________________   PHYSICAL EXAM:  VITAL SIGNS: ED Triage Vitals  Enc Vitals Group     BP 10/25/15 1235 129/88 mmHg     Pulse Rate 10/25/15 1235 83     Resp 10/25/15 1235 20     Temp 10/25/15 1235 98 F (36.7 C)     Temp Source 10/25/15 1235 Oral     SpO2 10/25/15 1235 98 %     Weight 10/25/15 1235 178 lb (80.74 kg)     Height 10/25/15 1235  (1.88 m)     Head Cir --      Peak Flow --      Pain Score 10/25/15 1236 0     Pain Loc --      Pain Edu? --      Excl. in GC? --     Constitutional: Alert and oriented. Well appearing and in no acute distress. Head: Atraumatic. Mouth/Throat: Mucous membranes are moist.  Oropharynx non-erythematous. Neck: No stridor.   Musculoskeletal: No lower extremity tenderness nor edema.  No joint effusions. Neurologic:  Normal speech and language. No gross focal neurologic deficits are appreciated. No gait instability. Skin:  Skin is warm, dry and intact. No rash noted. Multiple subcutaneous skin lesions consistent with dermatofibromas located throughout the body. Tenderness noted to the postauricular left ear. area Psychiatric:  Mood and affect are normal. Speech and behavior are normal.  ____________________________________________   LABS (all labs ordered are listed, but only abnormal results are displayed)  Labs Reviewed - No data to display   PROCEDURES  Procedure(s) performed: None  Critical Care performed: No  ____________________________________________   INITIAL IMPRESSION / ASSESSMENT AND PLAN / ED COURSE  Pertinent labs & imaging results that were available during my care of the patient were reviewed by me and considered in my medical decision making (see chart for details).  Multiple sebaceous cyst and epidermis is noted throughout the  body. Reassurance provided to the patient Rx given for Bactrim DS twice a day, Motrin 800 mg 3 times a day. Patient follow-up PCP referral given to surgery for evaluation of excision. Patient voices no other emergency medical complaints at this time. ____________________________________________   FINAL CLINICAL IMPRESSION(S) / ED DIAGNOSES  Final diagnoses:  Subcutaneous cysts, generalized     This chart was dictated using voice recognition software/Dragon. Despite best efforts to proofread, errors can occur which can change the meaning. Any change was purely unintentional.   Evangeline Dakin, PA-C 10/25/15 1459  Emily Filbert, MD 10/25/15 [phone]

## 2018-11-22 ENCOUNTER — Other Ambulatory Visit: Payer: Self-pay

## 2018-11-22 ENCOUNTER — Emergency Department

## 2018-11-22 ENCOUNTER — Emergency Department
Admission: EM | Admit: 2018-11-22 | Discharge: 2018-11-22 | Disposition: A | Discharge status: Home / Self Care | Attending: Emergency Medicine | Admitting: Emergency Medicine

## 2018-11-22 DIAGNOSIS — S0083XA Contusion of other part of head, initial encounter: Secondary | ICD-10-CM | POA: Diagnosis not present

## 2018-11-22 DIAGNOSIS — Y9389 Activity, other specified: Secondary | ICD-10-CM | POA: Insufficient documentation

## 2018-11-22 DIAGNOSIS — F17210 Nicotine dependence, cigarettes, uncomplicated: Secondary | ICD-10-CM | POA: Insufficient documentation

## 2018-11-22 DIAGNOSIS — S0101XA Laceration without foreign body of scalp, initial encounter: Secondary | ICD-10-CM | POA: Insufficient documentation

## 2018-11-22 DIAGNOSIS — S0181XA Laceration without foreign body of other part of head, initial encounter: Secondary | ICD-10-CM | POA: Insufficient documentation

## 2018-11-22 DIAGNOSIS — S0990XA Unspecified injury of head, initial encounter: Secondary | ICD-10-CM | POA: Diagnosis present

## 2018-11-22 DIAGNOSIS — Y92149 Unspecified place in prison as the place of occurrence of the external cause: Secondary | ICD-10-CM | POA: Diagnosis not present

## 2018-11-22 DIAGNOSIS — S41111A Laceration without foreign body of right upper arm, initial encounter: Secondary | ICD-10-CM | POA: Diagnosis not present

## 2018-11-22 DIAGNOSIS — Y999 Unspecified external cause status: Secondary | ICD-10-CM | POA: Diagnosis not present

## 2018-11-22 NOTE — ED Notes (Signed)
Patient transported to CT 
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
## 2019-01-05 ENCOUNTER — Emergency Department
Admission: EM | Admit: 2019-01-05 | Discharge: 2019-01-06 | Disposition: A | Discharge status: Home / Self Care | Payer: PRIVATE HEALTH INSURANCE | Attending: Emergency Medicine | Admitting: Emergency Medicine

## 2019-01-05 ENCOUNTER — Encounter: Payer: Self-pay | Admitting: Emergency Medicine

## 2019-01-05 ENCOUNTER — Other Ambulatory Visit: Payer: Self-pay

## 2019-01-05 DIAGNOSIS — R509 Fever, unspecified: Secondary | ICD-10-CM | POA: Insufficient documentation

## 2019-01-05 DIAGNOSIS — R112 Nausea with vomiting, unspecified: Secondary | ICD-10-CM

## 2019-01-05 DIAGNOSIS — F17210 Nicotine dependence, cigarettes, uncomplicated: Secondary | ICD-10-CM | POA: Insufficient documentation

## 2019-01-05 LAB — CBC WITH DIFFERENTIAL/PLATELET
Abs Immature Granulocytes: 0.03 10*3/uL (ref 0.00–0.07)
Basophils Absolute: 0.1 10*3/uL (ref 0.0–0.1)
Basophils Relative: 0 %
Eosinophils Absolute: 0.2 10*3/uL (ref 0.0–0.5)
Eosinophils Relative: 1 %
HCT: 48.4 % (ref 39.0–52.0)
Hemoglobin: 16.8 g/dL (ref 13.0–17.0)
Immature Granulocytes: 0 %
Lymphocytes Relative: 21 %
Lymphs Abs: 2.5 10*3/uL (ref 0.7–4.0)
MCH: 30.8 pg (ref 26.0–34.0)
MCHC: 34.7 g/dL (ref 30.0–36.0)
MCV: 88.8 fL (ref 80.0–100.0)
Monocytes Absolute: 0.9 10*3/uL (ref 0.1–1.0)
Monocytes Relative: 7 %
Neutro Abs: 8.4 10*3/uL — ABNORMAL HIGH (ref 1.7–7.7)
Neutrophils Relative %: 71 %
Platelets: 327 10*3/uL (ref 150–400)
RBC: 5.45 MIL/uL (ref 4.22–5.81)
RDW: 12.6 % (ref 11.5–15.5)
WBC: 12 10*3/uL — ABNORMAL HIGH (ref 4.0–10.5)
nRBC: 0 % (ref 0.0–0.2)

## 2019-01-05 LAB — COMPREHENSIVE METABOLIC PANEL
ALT: 14 U/L (ref 0–44)
AST: 17 U/L (ref 15–41)
Albumin: 4.6 g/dL (ref 3.5–5.0)
Alkaline Phosphatase: 62 U/L (ref 38–126)
Anion gap: 13 (ref 5–15)
BUN: 15 mg/dL (ref 6–20)
CO2: 30 mmol/L (ref 22–32)
Calcium: 9.6 mg/dL (ref 8.9–10.3)
Chloride: 94 mmol/L — ABNORMAL LOW (ref 98–111)
Creatinine, Ser: 1.03 mg/dL (ref 0.61–1.24)
GFR calc Af Amer: >60 mL/min (ref >60)
GFR calc non Af Amer: >60 mL/min (ref >60)
Glucose, Bld: 151 mg/dL — ABNORMAL HIGH (ref 70–99)
Potassium: 3.9 mmol/L (ref 3.5–5.1)
Sodium: 137 mmol/L (ref 135–145)
Total Bilirubin: 1.6 mg/dL — ABNORMAL HIGH (ref 0.3–1.2)
Total Protein: 8.1 g/dL (ref 6.5–8.1)

## 2019-01-05 LAB — LIPASE, BLOOD: Lipase: 29 U/L (ref 11–51)

## 2019-01-05 NOTE — ED Triage Notes (Signed)
Patient ambulatory to triage with steady gait, without difficulty or distress noted; pt reports N/V since this morning with fever 101; denies pain

## 2019-01-06 MED ORDER — ONDANSETRON 4 MG PO TBDP: ORAL_TABLET | ORAL | 0 refills | Status: DC

## 2019-01-06 MED ORDER — ONDANSETRON 4 MG PO TBDP
8.0000 mg | ORAL_TABLET | Freq: Once | ORAL | Status: AC
  Administered 2019-01-06: 8 mg via ORAL
  Filled 2019-01-06: qty 2

## 2019-01-06 NOTE — ED Notes (Signed)
Pt given ginger ale and crackers at this time to PO challenge.

## 2019-01-06 NOTE — ED Notes (Signed)
ED Provider at bedside. 

## 2019-01-06 NOTE — ED Provider Notes (Signed)
[facility] Emergency Department Provider Note  ____________________________________________   First MD Initiated Contact with Patient 01/05/19 2343     (approximate)  I have reviewed the triage vital signs and the nursing notes.   HISTORY  Chief Complaint Emesis    HPI [name] is a 38 y.o. male with medical history as listed below with no chronic medical conditions who presents for evaluation of acute onset and persistent nausea and vomiting throughout the day today.  He says he had a big breakfast and then within an hour of eating breakfast his stomach started to feel queasy and then he had "explosive" vomiting.  He states that he is tried multiple things throughout the day including ginger ale and water and soft serve ice cream but has not been able to keep anything down.  He claims that he had a fever earlier today of about 101 but it resolved on its own with no treatment.  He denies neck pain, neck stiffness, sore throat, chest pain, cough, abdominal pain, dysuria, and diarrhea.  He is currently under house arrest so he has not been out in about or around anyone other than his family and they have been trying to isolate themselves due to COVID-19.  He reports the symptoms were severe nothing particular helped him or made them worse, but he has not vomited now for couple of hours and is starting to feel better.         History reviewed. No pertinent past medical history.  There are no active problems to display for this patient.   History reviewed. No pertinent surgical history.  Prior to Admission medications   Medication Sig Start Date End Date Taking? Authorizing Provider  HYDROcodone-acetaminophen (NORCO) 5-325 MG tablet Take 1-2 tablets by mouth every 4 (four) hours as needed for moderate pain. 10/25/15   Beers, Charmayne Sheer, PA-C  ibuprofen (ADVIL,MOTRIN) 800 MG tablet Take 1 tablet (800 mg total) by mouth every 8 (eight) hours as needed.  10/25/15   Beers, Charmayne Sheer, PA-C  ondansetron (ZOFRAN ODT) 4 MG disintegrating tablet Allow 1-2 tablets to dissolve in your mouth every 8 hours as needed for nausea/vomiting 01/06/19   Loleta Rose, MD  sulfamethoxazole-trimethoprim (BACTRIM DS,SEPTRA DS) 800-160 MG tablet Take 1 tablet by mouth 2 (two) times daily. 10/25/15   Beers, Charmayne Sheer, PA-C    Allergies Patient has no known allergies.  No family history on file.  Social History Social History   Tobacco Use  . Smoking status: Current Every Day Smoker    Types: Cigarettes  . Smokeless tobacco: Never Used  Substance Use Topics  . Alcohol use: Yes    Comment: 2 - 40 OZ BEERS DAILY  . Drug use: Yes    Frequency: 7.0 times per week    Types: Cocaine    Comment: CRACK, last use w/i past 12 hrs    Review of Systems Constitutional: Fever earlier today to 101 but resolved on its own. Eyes: No visual changes. ENT: No sore throat. Cardiovascular: Denies chest pain. Respiratory: Denies shortness of breath. Gastrointestinal: Severe nausea and vomiting, no abdominal pain, no diarrhea. Genitourinary: Negative for dysuria. Musculoskeletal: Negative for neck pain.  Negative for back pain. Integumentary: Negative for rash. Neurological: Negative for headaches, focal weakness or numbness.   ____________________________________________   PHYSICAL EXAM:  VITAL SIGNS: ED Triage Vitals  Enc Vitals Group     BP 01/05/19 2322 124/84     Pulse Rate 01/05/19 2322 (!) 110  Resp 01/05/19 2322 18     Temp 01/05/19 2322 97.9 F (36.6 C)     Temp Source 01/05/19 2322 Oral     SpO2 01/05/19 2322 97 %     Weight 01/05/19 2320 80.7 kg (178 lb)     Height 01/05/19 2320 1.854 m (6\' 1" )     Head Circumference --      Peak Flow --      Pain Score 01/05/19 2320 0     Pain Loc --      Pain Edu? --      Excl. in GC? --     Constitutional: Alert and oriented. Well appearing and in no acute distress. Eyes: Conjunctivae are normal.   Head: Atraumatic. Nose: No congestion/rhinnorhea. Mouth/Throat: Mucous membranes are a little bit dry. Neck: No stridor.  No meningeal signs.   Cardiovascular: Normal rate, regular rhythm. Good peripheral circulation. Grossly normal heart sounds. Respiratory: Normal respiratory effort.  No retractions. No audible wheezing. Gastrointestinal: Soft and nontender. No distention.   Musculoskeletal: No lower extremity tenderness nor edema. No gross deformities of extremities. Thin and muscular body habitus. Neurologic:  Normal speech and language. No gross focal neurologic deficits are appreciated.  Skin:  Skin is warm, dry and intact. No rash noted. Psychiatric: Mood and affect are normal. Speech and behavior are normal.  ____________________________________________   LABS (all labs ordered are listed, but only abnormal results are displayed)  Labs Reviewed  CBC WITH DIFFERENTIAL/PLATELET - Abnormal; Notable for the following components:      Result Value   WBC 12.0 (*)    Neutro Abs 8.4 (*)    All other components within normal limits  COMPREHENSIVE METABOLIC PANEL - Abnormal; Notable for the following components:   Chloride 94 (*)    Glucose, Bld 151 (*)    Total Bilirubin 1.6 (*)    All other components within normal limits  LIPASE, BLOOD  URINALYSIS, COMPLETE (UACMP) WITH MICROSCOPIC   ____________________________________________  EKG  No indication for EKG ____________________________________________  RADIOLOGY   ED MD interpretation: No imaging ordered  Official radiology report(s): No results found.  ____________________________________________   PROCEDURES   Procedure(s) performed (including Critical Care):  Procedures   ____________________________________________   INITIAL IMPRESSION / MDM / ASSESSMENT AND PLAN / ED COURSE  As part of my medical decision making, I reviewed the following data within the electronic MEDICAL RECORD NUMBER Nursing notes  reviewed and incorporated, Labs reviewed , Notes from prior ED visits and [place] Controlled Substance Database      [name] was evaluated in Emergency Department on 01/06/2019 for the symptoms described in the history of present illness. He was evaluated in the context of the global COVID-19 pandemic, which necessitated consideration that the patient might be at risk for infection with the SARS-CoV-2 virus that causes COVID-19. Institutional protocols and algorithms that pertain to the evaluation of patients at risk for COVID-19 are in a state of rapid change based on information released by regulatory bodies including the CDC and federal and state organizations. These policies and algorithms were followed during the patient's care in the ED.  Differential diagnosis includes, but is not limited to, foodborne pathogen, viral gastritis, other nonspecific gastritis, SBO/ileus, biliary disease, much less likely COVID-19.  Although the patient was reportedly febrile at home he is not febrile now and he has no risk factors for COVID-19.  Based on the current testing algorithms he does not meet criteria for testing.  He has absolutely  no tenderness to palpation of his abdomen on exam and he feels much better at this time and has not vomited for a few hours.  I do not think that he requires a CT scan of the abdomen and pelvis.  I offered to obtain an acute abdomen series but at this point he just wants to go home because he feels so much better.  His lab work is all generally reassuring with only a slightly elevated total bilirubin which is likely reflective of his vomiting and a very slight leukocytosis that is also most likely reactive.  He has no respiratory symptoms.  He tolerated a p.o. challenge after Zofran 8 mg by mouth.  I gave my usual and customary return precautions and he agrees with the plan, ambulating quickly out of the emergency department in no distress.       ____________________________________________  FINAL CLINICAL IMPRESSION(S) / ED DIAGNOSES  Final diagnoses:  Non-intractable vomiting with nausea, unspecified vomiting type     MEDICATIONS GIVEN DURING THIS VISIT:  Medications  ondansetron (ZOFRAN-ODT) disintegrating tablet 8 mg (8 mg Oral Given 01/06/19 0024)     ED Discharge Orders         Ordered    ondansetron (ZOFRAN ODT) 4 MG disintegrating tablet     01/06/19 0033           Note:  This document was prepared using Dragon voice recognition software and may include unintentional dictation errors.   Loleta RoseForbach, Di Jasmer, MD 01/06/19 [phone]

## 2019-01-06 NOTE — Discharge Instructions (Signed)

## 2019-01-26 ENCOUNTER — Other Ambulatory Visit: Payer: Self-pay

## 2019-01-26 ENCOUNTER — Emergency Department
Admission: EM | Admit: 2019-01-26 | Discharge: 2019-01-26 | Disposition: A | Discharge status: Home / Self Care | Payer: Self-pay | Attending: Emergency Medicine | Admitting: Emergency Medicine

## 2019-01-26 ENCOUNTER — Emergency Department: Payer: Self-pay

## 2019-01-26 ENCOUNTER — Encounter: Payer: Self-pay | Admitting: Emergency Medicine

## 2019-01-26 DIAGNOSIS — Z5321 Procedure and treatment not carried out due to patient leaving prior to being seen by health care provider: Secondary | ICD-10-CM | POA: Insufficient documentation

## 2019-01-26 DIAGNOSIS — N5082 Scrotal pain: Secondary | ICD-10-CM | POA: Insufficient documentation

## 2019-01-26 DIAGNOSIS — R52 Pain, unspecified: Secondary | ICD-10-CM

## 2019-01-26 LAB — CBC WITH DIFFERENTIAL/PLATELET
Abs Immature Granulocytes: 0.03 10*3/uL (ref 0.00–0.07)
Basophils Absolute: 0 10*3/uL (ref 0.0–0.1)
Basophils Relative: 0 %
Eosinophils Absolute: 0.3 10*3/uL (ref 0.0–0.5)
Eosinophils Relative: 3 %
HCT: 47.9 % (ref 39.0–52.0)
Hemoglobin: 16.7 g/dL (ref 13.0–17.0)
Immature Granulocytes: 0 %
Lymphocytes Relative: 27 %
Lymphs Abs: 2.7 10*3/uL (ref 0.7–4.0)
MCH: 30.8 pg (ref 26.0–34.0)
MCHC: 34.9 g/dL (ref 30.0–36.0)
MCV: 88.4 fL (ref 80.0–100.0)
Monocytes Absolute: 0.8 10*3/uL (ref 0.1–1.0)
Monocytes Relative: 8 %
Neutro Abs: 6.4 10*3/uL (ref 1.7–7.7)
Neutrophils Relative %: 62 %
Platelets: 326 10*3/uL (ref 150–400)
RBC: 5.42 MIL/uL (ref 4.22–5.81)
RDW: 12.4 % (ref 11.5–15.5)
WBC: 10.3 10*3/uL (ref 4.0–10.5)
nRBC: 0 % (ref 0.0–0.2)

## 2019-01-26 LAB — COMPREHENSIVE METABOLIC PANEL
ALT: 19 U/L (ref 0–44)
AST: 26 U/L (ref 15–41)
Albumin: 4 g/dL (ref 3.5–5.0)
Alkaline Phosphatase: 50 U/L (ref 38–126)
Anion gap: 8 (ref 5–15)
BUN: 17 mg/dL (ref 6–20)
CO2: 26 mmol/L (ref 22–32)
Calcium: 9.1 mg/dL (ref 8.9–10.3)
Chloride: 103 mmol/L (ref 98–111)
Creatinine, Ser: 0.96 mg/dL (ref 0.61–1.24)
GFR calc Af Amer: >60 mL/min (ref >60)
GFR calc non Af Amer: >60 mL/min (ref >60)
Glucose, Bld: 135 mg/dL — ABNORMAL HIGH (ref 70–99)
Potassium: 4 mmol/L (ref 3.5–5.1)
Sodium: 137 mmol/L (ref 135–145)
Total Bilirubin: 0.4 mg/dL (ref 0.3–1.2)
Total Protein: 7 g/dL (ref 6.5–8.1)

## 2019-01-26 NOTE — ED Triage Notes (Addendum)
Pt presents to ED with painful knot on his scrotum. Pt states "there is a knot at the bottom of my sack and the base of my penis". States pain is unbearable. Reports he noticed it there last week and he thought it went away but felt it again today. Denies any other symptoms. States he has an ace bandage on it currently to help with the pain as instructed on-line.

## 2019-03-26 ENCOUNTER — Emergency Department
Admission: EM | Admit: 2019-03-26 | Discharge: 2019-03-27 | Disposition: A | Discharge status: Home / Self Care | Payer: Self-pay | Attending: Emergency Medicine | Admitting: Emergency Medicine

## 2019-03-26 ENCOUNTER — Other Ambulatory Visit: Payer: Self-pay

## 2019-03-26 DIAGNOSIS — F1510 Other stimulant abuse, uncomplicated: Secondary | ICD-10-CM | POA: Diagnosis present

## 2019-03-26 DIAGNOSIS — F319 Bipolar disorder, unspecified: Secondary | ICD-10-CM | POA: Insufficient documentation

## 2019-03-26 DIAGNOSIS — R5383 Other fatigue: Secondary | ICD-10-CM | POA: Insufficient documentation

## 2019-03-26 DIAGNOSIS — R451 Restlessness and agitation: Secondary | ICD-10-CM | POA: Insufficient documentation

## 2019-03-26 DIAGNOSIS — F17210 Nicotine dependence, cigarettes, uncomplicated: Secondary | ICD-10-CM | POA: Insufficient documentation

## 2019-03-26 HISTORY — DX: Bipolar disorder, unspecified: F31.9

## 2019-03-26 LAB — COMPREHENSIVE METABOLIC PANEL
ALT: 22 U/L (ref 0–44)
AST: 37 U/L (ref 15–41)
Albumin: 4.1 g/dL (ref 3.5–5.0)
Alkaline Phosphatase: 54 U/L (ref 38–126)
Anion gap: 9 (ref 5–15)
BUN: 15 mg/dL (ref 6–20)
CO2: 26 mmol/L (ref 22–32)
Calcium: 9.1 mg/dL (ref 8.9–10.3)
Chloride: 107 mmol/L (ref 98–111)
Creatinine, Ser: 1.33 mg/dL — ABNORMAL HIGH (ref 0.61–1.24)
GFR calc Af Amer: >60 mL/min (ref >60)
GFR calc non Af Amer: >60 mL/min (ref >60)
Glucose, Bld: 96 mg/dL (ref 70–99)
Potassium: 4.3 mmol/L (ref 3.5–5.1)
Sodium: 142 mmol/L (ref 135–145)
Total Bilirubin: 0.9 mg/dL (ref 0.3–1.2)
Total Protein: 7.3 g/dL (ref 6.5–8.1)

## 2019-03-26 LAB — CBC
HCT: 43.5 % (ref 39.0–52.0)
Hemoglobin: 15.2 g/dL (ref 13.0–17.0)
MCH: 31.5 pg (ref 26.0–34.0)
MCHC: 34.9 g/dL (ref 30.0–36.0)
MCV: 90.1 fL (ref 80.0–100.0)
Platelets: 313 10*3/uL (ref 150–400)
RBC: 4.83 MIL/uL (ref 4.22–5.81)
RDW: 12.8 % (ref 11.5–15.5)
WBC: 9.9 10*3/uL (ref 4.0–10.5)
nRBC: 0 % (ref 0.0–0.2)

## 2019-03-26 LAB — ETHANOL: Alcohol, Ethyl (B): <10 mg/dL (ref <10)

## 2019-03-26 LAB — SALICYLATE LEVEL: Salicylate Lvl: <7 mg/dL (ref 2.8–30.0)

## 2019-03-26 LAB — ACETAMINOPHEN LEVEL: Acetaminophen (Tylenol), Serum: <10 ug/mL — ABNORMAL LOW (ref 10–30)

## 2019-03-26 MED ORDER — LORAZEPAM 1 MG PO TABS
1.0000 mg | ORAL_TABLET | Freq: Once | ORAL | Status: AC
  Administered 2019-03-26: 1 mg via ORAL
  Filled 2019-03-26: qty 1

## 2019-03-26 NOTE — ED Triage Notes (Signed)
Pt to the er for hx of bipolar. Pt denies being SI or HI. Pt says today they had 6 kids running through the house and throwing water balloons and so pt went to his room and put the dresser in front of the door to be alone. Girlfriend called sheriff dept as she was worried. Pt is cooperative at this time. Pt was in prison and on his meds and has been seen at [facility] but they wrote no RX and [facility]. Pt admits his girlfriend is not used to seeing him like this as he has always been on his meds before.

## 2019-03-26 NOTE — ED Notes (Signed)
Patient took PO medication with no issues. Patient currently on phone with woman named Baxter Flattery.

## 2019-03-26 NOTE — ED Provider Notes (Signed)
[facility] Emergency Department Provider Note  ____________________________________________   First MD Initiated Contact with Patient 03/26/19 2059     (approximate)  I have reviewed the triage vital signs and the nursing notes.   HISTORY  Chief Complaint Manic Behavior    HPI Jackson Rodriguez is a 38 y.o. male here with manic behavior.  History somewhat limited.  Per report, the patient has bipolar disorder.  His girlfriend sent him here after calling the police due to concerns for his safety.  He reportedly has been acting very erratically at home, and locked himself in a another room, and blocked the door with a drawer.  She was concerned about him hurting himself.  He denies any suicidal homicidal ration, but has flights of ideas and rapid, pressured speech.  Admits he has not been on medications for the last 6 months.  No other complaints.        Past Medical History:  Diagnosis Date  . Bipolar 1 disorder ([place])     There are no active problems to display for this patient.   Past Surgical History:  Procedure Laterality Date  . HERNIA REPAIR      Prior to Admission medications   Medication Sig Start Date End Date Taking? Authorizing Provider  HYDROcodone-acetaminophen (NORCO) 5-325 MG tablet Take 1-2 tablets by mouth every 4 (four) hours as needed for moderate pain. 10/25/15   Beers, Pierce Crane, PA-C  ibuprofen (ADVIL,MOTRIN) 800 MG tablet Take 1 tablet (800 mg total) by mouth every 8 (eight) hours as needed. 10/25/15   Beers, Pierce Crane, PA-C  ondansetron (ZOFRAN ODT) 4 MG disintegrating tablet Allow 1-2 tablets to dissolve in your mouth every 8 hours as needed for nausea/vomiting 01/06/19   Hinda Kehr, MD  sulfamethoxazole-trimethoprim (BACTRIM DS,SEPTRA DS) 800-160 MG tablet Take 1 tablet by mouth 2 (two) times daily. 10/25/15   Beers, Pierce Crane, PA-C    Allergies Patient has no known allergies.  No family history on file.  Social History  Social History   Tobacco Use  . Smoking status: Current Every Day Smoker    Types: Cigarettes  . Smokeless tobacco: Never Used  Substance Use Topics  . Alcohol use: Yes    Comment: 1 beer last night  . Drug use: Not Currently    Types: Marijuana    Review of Systems  Review of Systems  Constitutional: Positive for fatigue. Negative for chills and fever.  HENT: Negative for sore throat.   Respiratory: Negative for shortness of breath.   Cardiovascular: Negative for chest pain.  Gastrointestinal: Negative for abdominal pain.  Genitourinary: Negative for flank pain.  Musculoskeletal: Negative for neck pain.  Skin: Negative for rash and wound.  Allergic/Immunologic: Negative for immunocompromised state.  Neurological: Negative for weakness and numbness.  Hematological: Does not bruise/bleed easily.  Psychiatric/Behavioral: Positive for agitation and decreased concentration. The patient is nervous/anxious.   All other systems reviewed and are negative.    ____________________________________________  PHYSICAL EXAM:      VITAL SIGNS: ED Triage Vitals  Enc Vitals Group     BP 03/26/19 2015 (!) 144/78     Pulse Rate 03/26/19 2015 99     Resp 03/26/19 2015 18     Temp 03/26/19 2015 98.2 F (36.8 C)     Temp Source 03/26/19 2015 Oral     SpO2 03/26/19 2015 98 %     Weight 03/26/19 2016 177 lb (80.3 kg)     Height 03/26/19 2016  6\' 2"  (1.88 m)     Head Circumference --      Peak Flow --      Pain Score 03/26/19 2016 0     Pain Loc --      Pain Edu? --      Excl. in GC? --      Physical Exam Vitals signs and nursing note reviewed.  Constitutional:      General: He is not in acute distress.    Appearance: He is well-developed.  HENT:     Head: Normocephalic and atraumatic.  Eyes:     Conjunctiva/sclera: Conjunctivae normal.  Neck:     Musculoskeletal: Neck supple.  Cardiovascular:     Rate and Rhythm: Normal rate and regular rhythm.     Heart sounds: Normal  heart sounds. No murmur. No friction rub.  Pulmonary:     Effort: Pulmonary effort is normal. No respiratory distress.     Breath sounds: Normal breath sounds. No wheezing or rales.  Abdominal:     General: There is no distension.     Palpations: Abdomen is soft.     Tenderness: There is no abdominal tenderness.  Skin:    General: Skin is warm.     Capillary Refill: Capillary refill takes less than 2 seconds.  Neurological:     Mental Status: He is alert and oriented to person, place, and time.     Motor: No abnormal muscle tone.  Psychiatric:        Attention and Perception: He is inattentive.        Mood and Affect: Mood is anxious.        Behavior: Behavior normal.        Thought Content: Thought content does not include suicidal ideation.        Judgment: Judgment is impulsive and inappropriate.       ____________________________________________   LABS (all labs ordered are listed, but only abnormal results are displayed)  Labs Reviewed  COMPREHENSIVE METABOLIC PANEL - Abnormal; Notable for the following components:      Result Value   Creatinine, Ser 1.33 (*)    All other components within normal limits  ACETAMINOPHEN LEVEL - Abnormal; Notable for the following components:   Acetaminophen (Tylenol), Serum <10 (*)    All other components within normal limits  ETHANOL  SALICYLATE LEVEL  CBC  URINE DRUG SCREEN, QUALITATIVE ([facility] ONLY)    ____________________________________________  EKG:  ________________________________________  RADIOLOGY All imaging, including plain films, CT scans, and ultrasounds, independently reviewed by me, and interpretations confirmed via formal radiology reads.  ED MD interpretation:   None  Official radiology report(s): No results found.  ____________________________________________  PROCEDURES   Procedure(s) performed (including Critical Care):  Procedures  ____________________________________________  INITIAL  IMPRESSION / MDM / ASSESSMENT AND PLAN / ED COURSE  As part of my medical decision making, I reviewed the following data within the electronic MEDICAL RECORD NUMBER Notes from prior ED visits and [place] Controlled Substance Database      *Jackson Rodriguez was evaluated in Emergency Department on 03/26/2019 for the symptoms described in the history of present illness. He was evaluated in the context of the global COVID-19 pandemic, which necessitated consideration that the patient might be at risk for infection with the SARS-CoV-2 virus that causes COVID-19. Institutional protocols and algorithms that pertain to the evaluation of patients at risk for COVID-19 are in a state of rapid change based on information released by regulatory bodies including the  CDC and federal and [employer] organizations. These policies and algorithms were followed during the patient's care in the ED.  Some ED evaluations and interventions may be delayed as a result of limited staffing during the pandemic.*      Medical Decision Making: 38 yo male with past medical history as above here with agitation.  History is somewhat limited due to patient evasiveness and hyperactivity.Will work to obtain collateral.  At this time, given concern for manic behavior, will need to IVC if he attempts to leave but he is voluntary at this time.  Lab work is reassuring.  UDS pending.  ____________________________________________  FINAL CLINICAL IMPRESSION(S) / ED DIAGNOSES  Final diagnoses:  Bipolar affective disorder, remission status unspecified (HCC)     MEDICATIONS GIVEN DURING THIS VISIT:  Medications  LORazepam (ATIVAN) tablet 1 mg (1 mg Oral Given 03/26/19 2331)     ED Discharge Orders    None       Note:  This document was prepared using Dragon voice recognition software and may include unintentional dictation errors.   Shaune PollackIsaacs, Tycho Cheramie, MD 03/26/19 2337
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
## 2019-03-26 NOTE — ED Notes (Signed)
Pt. Alert and oriented, warm and dry, in no distress. Pt. Denies SI, HI, and AVH. Pt. Encouraged to let nursing staff know of any concerns or needs. 

## 2019-03-27 ENCOUNTER — Encounter: Payer: Self-pay | Admitting: Psychiatry

## 2019-03-27 DIAGNOSIS — F1510 Other stimulant abuse, uncomplicated: Secondary | ICD-10-CM | POA: Diagnosis present

## 2019-03-27 MED ORDER — OLANZAPINE 5 MG PO TABS
5.0000 mg | ORAL_TABLET | Freq: Two times a day (BID) | ORAL | Status: DC
  Filled 2019-03-27: qty 1

## 2019-03-27 MED ORDER — OLANZAPINE 5 MG PO TABS: 5.0000 mg | ORAL_TABLET | Freq: Every day | ORAL | Status: DC

## 2019-03-27 MED ORDER — OLANZAPINE 5 MG PO TABS: 5.0000 mg | ORAL_TABLET | Freq: Two times a day (BID) | ORAL | 0 refills | Status: AC

## 2019-03-27 MED ORDER — OLANZAPINE 5 MG PO TABS: 5.0000 mg | ORAL_TABLET | Freq: Every day | ORAL | 0 refills | Status: DC

## 2019-03-27 NOTE — BH Assessment (Signed)
Writer spoke with patient to complete an updated/reassessment. Patient continues to deny SI/HI and AV/H. He gave Probation officer permission to talk with his girlfriend because he was unable to get in touch with his sister.  Writer called and left a HIPPA Compliant message with girlfriend Baxter Flattery [phone]), requesting a return phone call.

## 2019-03-27 NOTE — Consult Note (Signed)
[facility] ED Discharge  03/27/2019 5:47 PM EESA JUSTISS  MRN:  [id number] Principal Problem: Methamphetamine abuse [facility]) Discharge Diagnoses: Principal Problem:   Methamphetamine abuse ([place])  Subjective:  "I'm good".  Denies suicidal/homicidal ideations, hallucinations, and withdrawal symptoms.  Reports using methamphetamines prior to admission.    HPI:  This provider was not consulted but saw the patient for medication request for Zyprexa.  He was Rx this from [place] due to hallucinations and mood issues related to his past history of cocaine and current use of methamphetamine abuse.  Zyprexa 5 mg BID ordered with Rx provided.    PER TTS:  38 y.o. male who presents to the ER after his girlfriend called law enforcement because she was concerned about his safety. Per the patient, he was annoyed due to the six children that was in the house. The patient's girlfriend and her friend were allowing their children to "run around, yelling and throwing water balloons." Patient admits to barricading his self in his room, by putting a dresser in front of the door. His girlfriend told him to come out the room or she's going to call 911. "I can't see you and I don't know what you're going to do." When patient heard her on the phone with 911, he came out of the room and told her to hang up. When law enforcement arrived, he explained to them what happened and denied SI/HI and AV/H. Per patient, "I told them I was okay but they told me to come up here and get checked out and I'll be out of here in a couple of hours."  Patient states he currently receives mental health services through [facility] and [employer]. He's states he enjoys the CBT classes with [facility] and they classes are court ordered.  He's been with the agencies for approximately four months and haven't received any medications. When he was discharge from jail, he wasn't giving any medications to take with him.   Patient  further reports, he has a history of multiple incarcerations, which total to approximately eleven years. He met his current girlfriend the "last four years of a six year bid."  He had a cell phone in jail and met her through an app.  During the interview, the patient was calm and cooperative. He was able to provide the appropriate answers to the questions. Reports he is currently on parole and wears a "ankle monitor." He was recently got of house arrest. He was living with his mother but moved back in with the girlfriend approximately two weeks ago. "We always on and off." He states he's doing well and hopeful for his future. He denies having a history SI, gestures and attempts. "I love myself too much. I don't want to hurt myself."  Total Time spent with patient: 30 minutes  Past Psychiatric History: substance abuse  Past Medical History:  Past Medical History:  Diagnosis Date  . Bipolar 1 disorder [facility])     Past Surgical History:  Procedure Laterality Date  . HERNIA REPAIR     Family History: No family history on file. Family Psychiatric  History: none Social History:  Social History   Substance and Sexual Activity  Alcohol Use Yes   Comment: 1 beer last night     Social History   Substance and Sexual Activity  Drug Use Not Currently  . Types: Marijuana    Social History   Socioeconomic History  . Marital status: Single    Spouse name: Not  on file  . Number of children: Not on file  . Years of education: Not on file  . Highest education level: Not on file  Occupational History  . Not on file  Social Needs  . Financial resource strain: Not on file  . Food insecurity    Worry: Not on file    Inability: Not on file  . Transportation needs    Medical: Not on file    Non-medical: Not on file  Tobacco Use  . Smoking status: Current Every Day Smoker    Types: Cigarettes  . Smokeless tobacco: Never Used  Substance and Sexual Activity  . Alcohol use: Yes    Comment:  1 beer last night  . Drug use: Not Currently    Types: Marijuana  . Sexual activity: Not on file  Lifestyle  . Physical activity    Days per week: Not on file    Minutes per session: Not on file  . Stress: Not on file  Relationships  . Social Herbalist on phone: Not on file    Gets together: Not on file    Attends religious service: Not on file    Active member of club or organization: Not on file    Attends meetings of clubs or organizations: Not on file    Relationship status: Not on file  Other Topics Concern  . Not on file  Social History Narrative  . Not on file    Has this patient used any form of tobacco in the last 30 days? (Cigarettes, Smokeless Tobacco, Cigars, and/or Pipes) A prescription for an FDA-approved tobacco cessation medication was offered at discharge and the patient refused  Current Medications: No current facility-administered medications for this encounter.    Current Outpatient Medications  Medication Sig Dispense Refill  . OLANZapine (ZYPREXA) 5 MG tablet Take 1 tablet (5 mg total) by mouth 2 (two) times a day. 60 tablet 0   PTA Medications: (Not in a hospital admission)   Musculoskeletal: Strength & Muscle Tone: within normal limits Gait & Station: normal Patient leans: N/A  Psychiatric Specialty Exam: Physical Exam  Nursing note and vitals reviewed. Constitutional: He is oriented to person, place, and time. He appears well-developed and well-nourished.  HENT:  Head: Normocephalic.  Neck: Normal range of motion.  Respiratory: Effort normal.  Neurological: He is alert and oriented to person, place, and time.  Psychiatric: His speech is normal and behavior is normal. Judgment and thought content normal. His mood appears anxious. His affect is blunt. Cognition and memory are normal.    Review of Systems  Psychiatric/Behavioral: Positive for substance abuse. The patient is nervous/anxious.   All other systems reviewed and are  negative.   Blood pressure 132/76, pulse 86, temperature 97.9 F (36.6 C), temperature source Oral, resp. rate 18, height '6\' 2"'$  (1.88 m), weight 80.3 kg, SpO2 98 %.Body mass index is 22.73 kg/m.  General Appearance: Disheveled  Eye Contact:  Good  Speech:  Normal Rate  Volume:  Normal  Mood:  Anxious, mild  Affect:  Blunt  Thought Process:  Coherent and Descriptions of Associations: Intact  Orientation:  Full (Time, Place, and Person)  Thought Content:  WDL and Logical  Suicidal Thoughts:  No  Homicidal Thoughts:  No  Memory:  Immediate;   Good Recent;   Good Remote;   Good  Judgement:  Fair  Insight:  Good  Psychomotor Activity:  Normal  Concentration:  Concentration: Good and Attention Span: Good  Recall:  Roel Cluck of Knowledge:  Fair  Language:  Good  Akathisia:  No  Handed:  Right  AIMS (if indicated):     Assets:  Housing Leisure Time Physical Health Resilience Social Support  ADL's:  Intact  Cognition:  WNL  Sleep:        Demographic Factors:  Male and Caucasian  Loss Factors: Legal issues  Historical Factors: NA  Risk Reduction Factors:   Sense of responsibility to family, Living with another person, especially a relative, Positive social support and Positive therapeutic relationship  Continued Clinical Symptoms:  Anxiety, mild  Cognitive Features That Contribute To Risk:  None    Suicide Risk:  Minimal: No identifiable suicidal ideation.  Patients presenting with no risk factors but with morbid ruminations; may be classified as minimal risk based on the severity of the depressive symptoms  Follow-up Information    [place] to.   Contact information: [place] [id number] [phone]           Plan Of Care/Follow-up recommendations:  Methamphetamine abuse induced mood disorder: -Continued Zyprexa 5 mg BID from his recent jail stay -Rx for Zyprexa provided Activity:  as tolerated Diet:   heart healthy diet  Disposition: discharge home Waylan Boga, NP 03/27/2019, 5:47 PM

## 2019-03-27 NOTE — BH Assessment (Cosign Needed)
Writer spoke with patient's girlfriend ([phone]) and she reports she does not have any concerns about the safety of the patient, neither concerns of the patient harming anyone else. She further reports, the patient has a history of yelling and cursing at her. He has no history of physical abuse towards her. She also reports the patient wrote a suicidal note and barricade himself in the room. Writer asked for a copy of the note. She states, law enforcement took the note with them and told her they were going to place him under IVC. Patient arrived to the ER voluntarily with law enforcement with no note. When law enforcement brought him, there was no mentioning of any note or patient voicing SI.   Girlfriend also reports the patient have accused her of doing things she hasn't done. Such as sleeping with other men and other things that cause him to believe she's not faithful.  "It's like when I leave his sight, he thinks I'm doing something. One time he said I was having sex in the driveway but I was in the next room over." She further reports, when family or friends come to the home, he starts pacing and isolate from others. She believes patient needs medication, "he was taking something when he was locked up but I don't know what it was." Since he's been home, he hasn't had any medications. She confirmed he attends group therapy, via phone calls, with [facility]. He has made multiple attempts to contact [facility] for additional services but have been unsuccessful.  After this Probation officer spoke with the girlfriend, he asked patient about the suicide note. He reports, "Oh, naw that was from years ago." His girlfriend gave it to the officers when they arrived. After he told the officers how old it was and he "wrote it to vent" and not to let people know he is going to harm his self. Per his report, the officers "threw the letter away," while at the home of the patient and his girlfriend. "See, that's  why I didn't want you calling her. She be adding stuff and making stuff up. I love her but she's a police calling bitch. But that's my girl. I love her."  Patient continues to deny SI/HI and AV/H.  Writer discussed patient with Psychiatric Nurse Practitioner Theodoro Clock L). Patient is to be discharge. He was provided a prescription for the medication he was taking in jail. Patient was giving contact information and instructions on follow-up with [facility]. He was also provided with the contact information for [facility]'s "Microbiologist" for additional assistance, if he has any barriers with getting connected with outpatient treatment.

## 2019-03-27 NOTE — ED Notes (Signed)
Pt agitated about being here. In hall talking loud. Verbally aggressive but not physically aggressive at this time. Taking shower, awaiting discharge.

## 2019-03-27 NOTE — BH Assessment (Addendum)
Writer informed the patient, he would need to obtain collateral information and have a safety plan in order for him to discharge. Patient stated, the police told him he would be seen, get started on medications and discharge home. Writer explain to the patient the process of getting discharge from a behavioral medicine perspective and the importance of collateral information and safety plans. Patient was still hesitant for writer to contact his girlfriend. Writer asked if he could call someone else to verify what he shared and provide the information needed for proper/safe discharge.   Writer called and left a HIPPA Compliant message with sister ([phone]), requesting a return phone call.

## 2019-09-18 DIAGNOSIS — B1920 Unspecified viral hepatitis C without hepatic coma: Secondary | ICD-10-CM

## 2019-09-18 HISTORY — DX: Unspecified viral hepatitis C without hepatic coma: B19.20

## 2020-02-06 ENCOUNTER — Other Ambulatory Visit: Payer: Self-pay

## 2020-02-06 ENCOUNTER — Encounter: Payer: Self-pay | Admitting: Emergency Medicine

## 2020-02-06 DIAGNOSIS — F17210 Nicotine dependence, cigarettes, uncomplicated: Secondary | ICD-10-CM | POA: Insufficient documentation

## 2020-02-06 DIAGNOSIS — Z79899 Other long term (current) drug therapy: Secondary | ICD-10-CM | POA: Insufficient documentation

## 2020-02-06 DIAGNOSIS — Z765 Malingerer [conscious simulation]: Secondary | ICD-10-CM | POA: Insufficient documentation

## 2020-02-06 NOTE — ED Triage Notes (Signed)
Pt arrives POV to triage with the [facility] office for a near syncopal episode when he was being taken to jail. Pt looks tired at this time but is able to answer questions appropriately at this time.

## 2020-02-07 ENCOUNTER — Emergency Department
Admission: EM | Admit: 2020-02-07 | Discharge: 2020-02-07 | Disposition: A | Discharge status: Home / Self Care | Attending: Emergency Medicine | Admitting: Emergency Medicine

## 2020-02-07 DIAGNOSIS — Z765 Malingerer [conscious simulation]: Secondary | ICD-10-CM

## 2020-02-07 LAB — CBC
HCT: 44.4 % (ref 39.0–52.0)
Hemoglobin: 15.4 g/dL (ref 13.0–17.0)
MCH: 30.9 pg (ref 26.0–34.0)
MCHC: 34.7 g/dL (ref 30.0–36.0)
MCV: 89.2 fL (ref 80.0–100.0)
Platelets: 353 10*3/uL (ref 150–400)
RBC: 4.98 MIL/uL (ref 4.22–5.81)
RDW: 12.9 % (ref 11.5–15.5)
WBC: 12.4 10*3/uL — ABNORMAL HIGH (ref 4.0–10.5)
nRBC: 0 % (ref 0.0–0.2)

## 2020-02-07 LAB — BASIC METABOLIC PANEL
Anion gap: 10 (ref 5–15)
BUN: 16 mg/dL (ref 6–20)
CO2: 26 mmol/L (ref 22–32)
Calcium: 9.4 mg/dL (ref 8.9–10.3)
Chloride: 103 mmol/L (ref 98–111)
Creatinine, Ser: 1.46 mg/dL — ABNORMAL HIGH (ref 0.61–1.24)
GFR calc Af Amer: >60 mL/min (ref >60)
GFR calc non Af Amer: >60 mL/min (ref >60)
Glucose, Bld: 101 mg/dL — ABNORMAL HIGH (ref 70–99)
Potassium: 3.9 mmol/L (ref 3.5–5.1)
Sodium: 139 mmol/L (ref 135–145)

## 2020-02-07 LAB — ETHANOL: Alcohol, Ethyl (B): <10 mg/dL (ref <10)
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
## 2020-02-07 NOTE — ED Notes (Signed)
Patient refusing in and out cath for urine and refuses to provide urine specimen.  Patient states he does not want to receive care.  MD aware.

## 2020-02-07 NOTE — ED Notes (Signed)
Patient refused to sign discharge instructions. 

## 2020-02-07 NOTE — ED Notes (Signed)
This RN attempted phlebotomy x 2 without success. Lab called at this time for blood work.

## 2020-02-07 NOTE — ED Notes (Signed)
Patient refusing to answer questions for this RN or provide urine specimen.  Per MD to in and out cath for urine in attempt to determine why patient appears to be unresponsive.

## 2021-01-14 ENCOUNTER — Emergency Department
Admission: EM | Admit: 2021-01-14 | Discharge: 2021-01-15 | Attending: Emergency Medicine | Admitting: Emergency Medicine

## 2021-01-14 ENCOUNTER — Other Ambulatory Visit: Payer: Self-pay

## 2021-01-14 DIAGNOSIS — F1510 Other stimulant abuse, uncomplicated: Secondary | ICD-10-CM | POA: Insufficient documentation

## 2021-01-14 DIAGNOSIS — R109 Unspecified abdominal pain: Secondary | ICD-10-CM | POA: Insufficient documentation

## 2021-01-14 DIAGNOSIS — F17210 Nicotine dependence, cigarettes, uncomplicated: Secondary | ICD-10-CM | POA: Insufficient documentation

## 2021-01-14 LAB — COMPREHENSIVE METABOLIC PANEL
ALT: 22 U/L (ref 0–44)
AST: 57 U/L — ABNORMAL HIGH (ref 15–41)
Albumin: 4.1 g/dL (ref 3.5–5.0)
Alkaline Phosphatase: 60 U/L (ref 38–126)
Anion gap: 14 (ref 5–15)
BUN: 31 mg/dL — ABNORMAL HIGH (ref 6–20)
CO2: 21 mmol/L — ABNORMAL LOW (ref 22–32)
Calcium: 8.9 mg/dL (ref 8.9–10.3)
Chloride: 100 mmol/L (ref 98–111)
Creatinine, Ser: 1.5 mg/dL — ABNORMAL HIGH (ref 0.61–1.24)
GFR, Estimated: >60 mL/min (ref >60)
Glucose, Bld: 73 mg/dL (ref 70–99)
Potassium: 4.7 mmol/L (ref 3.5–5.1)
Sodium: 135 mmol/L (ref 135–145)
Total Bilirubin: 2.6 mg/dL — ABNORMAL HIGH (ref 0.3–1.2)
Total Protein: 7 g/dL (ref 6.5–8.1)

## 2021-01-14 LAB — SALICYLATE LEVEL: Salicylate Lvl: <7 mg/dL — ABNORMAL LOW (ref 7.0–30.0)

## 2021-01-14 LAB — CBC
HCT: 45.3 % (ref 39.0–52.0)
Hemoglobin: 15.3 g/dL (ref 13.0–17.0)
MCH: 30.7 pg (ref 26.0–34.0)
MCHC: 33.8 g/dL (ref 30.0–36.0)
MCV: 91 fL (ref 80.0–100.0)
Platelets: 329 10*3/uL (ref 150–400)
RBC: 4.98 MIL/uL (ref 4.22–5.81)
RDW: 13.2 % (ref 11.5–15.5)
WBC: 14.8 10*3/uL — ABNORMAL HIGH (ref 4.0–10.5)
nRBC: 0 % (ref 0.0–0.2)

## 2021-01-14 LAB — LIPASE, BLOOD: Lipase: 28 U/L (ref 11–51)

## 2021-01-14 LAB — ETHANOL: Alcohol, Ethyl (B): <10 mg/dL (ref <10)

## 2021-01-14 LAB — ACETAMINOPHEN LEVEL: Acetaminophen (Tylenol), Serum: <10 ug/mL — ABNORMAL LOW (ref 10–30)

## 2021-01-14 NOTE — ED Notes (Signed)
Discussed pt with Dr Lenard Lance
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
## 2021-01-15 NOTE — ED Provider Notes (Signed)
Patient monitored for 8 hours per poison control recommendation.  Remains well-appearing and completely asymptomatic with normal vital signs.  Will be discharged to the custody of police officers.   Nita Sickle, MD 01/15/21 [phone]

## 2021-01-15 NOTE — ED Notes (Signed)
Pt resting comfortably. A+O x4, easily aroused, and states "I'm sore, I've been running in the woods for 2 days.".

## 2021-01-22 ENCOUNTER — Emergency Department
Admission: EM | Admit: 2021-01-22 | Discharge: 2021-01-22 | Disposition: A | Discharge status: Home / Self Care | Attending: Emergency Medicine | Admitting: Emergency Medicine

## 2021-01-22 ENCOUNTER — Other Ambulatory Visit: Payer: Self-pay

## 2021-01-22 ENCOUNTER — Emergency Department

## 2021-01-22 DIAGNOSIS — R400 Somnolence: Secondary | ICD-10-CM | POA: Insufficient documentation

## 2021-01-22 DIAGNOSIS — F17210 Nicotine dependence, cigarettes, uncomplicated: Secondary | ICD-10-CM | POA: Insufficient documentation

## 2021-01-22 DIAGNOSIS — Z139 Encounter for screening, unspecified: Secondary | ICD-10-CM | POA: Insufficient documentation

## 2021-01-22 LAB — COMPREHENSIVE METABOLIC PANEL
ALT: 26 U/L (ref 0–44)
AST: 26 U/L (ref 15–41)
Albumin: 3.9 g/dL (ref 3.5–5.0)
Alkaline Phosphatase: 53 U/L (ref 38–126)
Anion gap: 9 (ref 5–15)
BUN: 13 mg/dL (ref 6–20)
CO2: 26 mmol/L (ref 22–32)
Calcium: 9 mg/dL (ref 8.9–10.3)
Chloride: 101 mmol/L (ref 98–111)
Creatinine, Ser: 0.97 mg/dL (ref 0.61–1.24)
GFR, Estimated: >60 mL/min (ref >60)
Glucose, Bld: 102 mg/dL — ABNORMAL HIGH (ref 70–99)
Potassium: 4.1 mmol/L (ref 3.5–5.1)
Sodium: 136 mmol/L (ref 135–145)
Total Bilirubin: 0.9 mg/dL (ref 0.3–1.2)
Total Protein: 6.9 g/dL (ref 6.5–8.1)

## 2021-01-22 LAB — CBC WITH DIFFERENTIAL/PLATELET
Abs Immature Granulocytes: 0.03 10*3/uL (ref 0.00–0.07)
Basophils Absolute: 0.1 10*3/uL (ref 0.0–0.1)
Basophils Relative: 1 %
Eosinophils Absolute: 0.1 10*3/uL (ref 0.0–0.5)
Eosinophils Relative: 2 %
HCT: 43.1 % (ref 39.0–52.0)
Hemoglobin: 14.8 g/dL (ref 13.0–17.0)
Immature Granulocytes: 0 %
Lymphocytes Relative: 13 %
Lymphs Abs: 1.2 10*3/uL (ref 0.7–4.0)
MCH: 30.7 pg (ref 26.0–34.0)
MCHC: 34.3 g/dL (ref 30.0–36.0)
MCV: 89.4 fL (ref 80.0–100.0)
Monocytes Absolute: 0.6 10*3/uL (ref 0.1–1.0)
Monocytes Relative: 7 %
Neutro Abs: 7.4 10*3/uL (ref 1.7–7.7)
Neutrophils Relative %: 77 %
Platelets: 308 10*3/uL (ref 150–400)
RBC: 4.82 MIL/uL (ref 4.22–5.81)
RDW: 12.8 % (ref 11.5–15.5)
WBC: 9.4 10*3/uL (ref 4.0–10.5)
nRBC: 0 % (ref 0.0–0.2)

## 2021-01-22 LAB — ETHANOL: Alcohol, Ethyl (B): <10 mg/dL (ref <10)

## 2021-01-22 LAB — SALICYLATE LEVEL: Salicylate Lvl: <7 mg/dL — ABNORMAL LOW (ref 7.0–30.0)

## 2021-01-22 LAB — ACETAMINOPHEN LEVEL: Acetaminophen (Tylenol), Serum: <10 ug/mL — ABNORMAL LOW (ref 10–30)
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
## 2021-02-02 ENCOUNTER — Emergency Department
Admission: EM | Admit: 2021-02-02 | Discharge: 2021-02-02 | Disposition: A | Discharge status: Home / Self Care | Payer: Medicaid Other | Attending: Emergency Medicine | Admitting: Emergency Medicine

## 2021-02-02 ENCOUNTER — Other Ambulatory Visit: Payer: Self-pay

## 2021-02-02 DIAGNOSIS — Z139 Encounter for screening, unspecified: Secondary | ICD-10-CM | POA: Insufficient documentation

## 2021-02-02 DIAGNOSIS — F17210 Nicotine dependence, cigarettes, uncomplicated: Secondary | ICD-10-CM | POA: Insufficient documentation

## 2021-02-02 DIAGNOSIS — K4090 Unilateral inguinal hernia, without obstruction or gangrene, not specified as recurrent: Secondary | ICD-10-CM | POA: Insufficient documentation

## 2021-02-02 NOTE — ED Notes (Signed)
See triage note  States he noticed a "knot" to right groin area several years ago  States he did have a hernia repair to same place  States he needs area cleared before going to rehab

## 2021-02-02 NOTE — Discharge Instructions (Addendum)
Your examination today is normal, and we do not find any physical issues requiring treatment right now.  You are medically stable to proceed with your substance abuse rehab program.  Please proceed with your substance abuse rehabilitation program as planned.

## 2021-02-02 NOTE — ED Triage Notes (Signed)
First Nurse note:  States needs hernia checked so that he can go to rehab.    AAOx3.  Skin warm and dry. NAD

## 2021-02-02 NOTE — ED Triage Notes (Signed)
Pt arrived to ed via pov. States he had inguinal hernia repair in 2017 with mesh sling placement at that time. Pt states there is a knot in same location as pervious hernia. NAD noted at this time

## 2021-02-02 NOTE — ED Provider Notes (Signed)
[facility] Emergency Department Provider Note  ____________________________________________  Time seen: Approximately 12:51 PM  I have reviewed the triage vital signs and the nursing notes.   HISTORY  Chief Complaint Inguinal Hernia    HPI Jackson Rodriguez is a 40 y.o. male with a past history of drug abuse who comes for medical screening exam to allow him to start a drug treatment program.  Reports that he has arranged to start rehab, but they were worried that he might have a hernia and wanted him to be evaluated first.  Patient denies any acute complaints.  No pain, normal bowel movements.  No vomiting.  Eating normally.  He is very motivated to continue with his substance abuse treatment.   Triage note reviewed, noting possibility of recurrent inguinal hernia   Past Medical History:  Diagnosis Date  . Hepatitis C virus 2021     Patient Active Problem List   Diagnosis Date Noted  . 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 HERNIA REPAIR       Prior to Admission medications   Medication Sig Start Date End Date Taking? Authorizing Provider  OLANZapine (ZYPREXA) 5 MG tablet Take 1 tablet (5 mg total) by mouth 2 (two) times a day. 03/27/19   Charm Rings, NP     Allergies Patient has no known allergies.   History reviewed. No pertinent family history.  Social History Social History   Tobacco Use  . Smoking status: Current Every Day Smoker    Types: Cigarettes  . Smokeless tobacco: Never Used  Vaping Use  . Vaping Use: Never used  Substance Use Topics  . Alcohol use: Not Currently    Comment: 1 beer last night  . Drug use: Yes    Types: Marijuana, Cocaine, Methamphetamines    Comment: methamphetamine last used 5/18    Review of Systems  Constitutional:   No fever or chills.  ENT:   No sore throat. No rhinorrhea. Cardiovascular:   No chest pain or  syncope. Respiratory:   No dyspnea or cough. Gastrointestinal:   Negative for abdominal pain, vomiting and diarrhea.  Musculoskeletal:   Negative for focal pain or swelling All other systems reviewed and are negative except as documented above in ROS and HPI.  ____________________________________________   PHYSICAL EXAM:  VITAL SIGNS: ED Triage Vitals [02/02/21 1152]  Enc Vitals Group     BP (!) 127/109     Pulse Rate (!) 110     Resp 18     Temp 98.3 F (36.8 C)     Temp Source Oral     SpO2 100 %     Weight 170 lb (77.1 kg)     Height 6\' 1"  (1.854 m)     Head Circumference      Peak Flow      Pain Score 0     Pain Loc      Pain Edu?      Excl. in GC?     Vital signs reviewed, nursing assessments reviewed.   Constitutional:   Alert and oriented. Non-toxic appearance. Eyes:   Conjunctivae are normal. EOMI. ENT      Head:   Normocephalic and atraumatic.         Neck:   No meningismus. Full ROM. Hematological/Lymphatic/Immunilogical:   No cervical lymphadenopathy. Cardiovascular:   RRR.  Cap refill less than 2 seconds. Respiratory:   Normal respiratory effort without tachypnea/retractions  Gastrointestinal:   Soft and nontender. Non distended.   No rebound, rigidity, or guarding.  No bulging or palpable fascial defect. Genitourinary: Normal genitalia, no tenderness or swelling. Musculoskeletal:   Normal range of motion in all extremities.  No edema. Neurologic:   Normal speech and language.  Motor grossly intact. No acute focal neurologic deficits are appreciated.  Skin:    Skin is warm, dry and intact. No rash noted.  No wounds.  ____________________________________________    LABS (pertinent positives/negatives) (all labs ordered are listed, but only abnormal results are displayed) Labs Reviewed - No data to display ____________________________________________   EKG  ____________________________________________    RADIOLOGY  No results  found.  ____________________________________________   PROCEDURES Procedures  ____________________________________________  CLINICAL IMPRESSION / ASSESSMENT AND PLAN / ED COURSE  Pertinent labs & imaging results that were available during my care of the patient were reviewed by me and considered in my medical decision making (see chart for details).  [name] was evaluated in Emergency Department on 02/02/2021 for the symptoms described in the history of present illness. He was evaluated in the context of the global COVID-19 pandemic, which necessitated consideration that the patient might be at risk for infection with the SARS-CoV-2 virus that causes COVID-19. Institutional protocols and algorithms that pertain to the evaluation of patients at risk for COVID-19 are in a state of rapid change based on information released by regulatory bodies including the CDC and federal and state organizations. These policies and algorithms were followed during the patient's care in the ED.   Patient presents for medical screening exam before starting substance abuse rehab program.  He is nontoxic well-appearing ambulatory, normal vital signs, normal exam.  No evidence of incarcerated hernia or other acute medical issue.  He is medically stable to proceed with his rehab program, can seek medical care in the future as needed.      ____________________________________________   FINAL CLINICAL IMPRESSION(S) / ED DIAGNOSES    Final diagnoses:  Encounter for medical screening examination     ED Discharge Orders    None      Portions of this note were generated with dragon dictation software. Dictation errors may occur despite best attempts at proofreading.   Sharman Cheek, MD 02/02/21 1255

## 2021-02-28 ENCOUNTER — Emergency Department: Payer: Medicaid Other

## 2021-02-28 ENCOUNTER — Other Ambulatory Visit: Payer: Self-pay

## 2021-02-28 ENCOUNTER — Emergency Department
Admission: EM | Admit: 2021-02-28 | Discharge: 2021-02-28 | Disposition: A | Discharge status: Home / Self Care | Payer: Medicaid Other | Attending: Emergency Medicine | Admitting: Emergency Medicine

## 2021-02-28 DIAGNOSIS — F17210 Nicotine dependence, cigarettes, uncomplicated: Secondary | ICD-10-CM | POA: Insufficient documentation

## 2021-02-28 DIAGNOSIS — X58XXXA Exposure to other specified factors, initial encounter: Secondary | ICD-10-CM | POA: Insufficient documentation

## 2021-02-28 DIAGNOSIS — F1910 Other psychoactive substance abuse, uncomplicated: Secondary | ICD-10-CM | POA: Insufficient documentation

## 2021-02-28 DIAGNOSIS — T402X1A Poisoning by other opioids, accidental (unintentional), initial encounter: Secondary | ICD-10-CM | POA: Insufficient documentation

## 2021-02-28 LAB — CBC WITH DIFFERENTIAL/PLATELET
Abs Immature Granulocytes: 0.03 10*3/uL (ref 0.00–0.07)
Basophils Absolute: 0 10*3/uL (ref 0.0–0.1)
Basophils Relative: 0 %
Eosinophils Absolute: 0.2 10*3/uL (ref 0.0–0.5)
Eosinophils Relative: 2 %
HCT: 41.9 % (ref 39.0–52.0)
Hemoglobin: 14.9 g/dL (ref 13.0–17.0)
Immature Granulocytes: 0 %
Lymphocytes Relative: 14 %
Lymphs Abs: 1.6 10*3/uL (ref 0.7–4.0)
MCH: 32 pg (ref 26.0–34.0)
MCHC: 35.6 g/dL (ref 30.0–36.0)
MCV: 90.1 fL (ref 80.0–100.0)
Monocytes Absolute: 0.8 10*3/uL (ref 0.1–1.0)
Monocytes Relative: 7 %
Neutro Abs: 8.7 10*3/uL — ABNORMAL HIGH (ref 1.7–7.7)
Neutrophils Relative %: 77 %
Platelets: 304 10*3/uL (ref 150–400)
RBC: 4.65 MIL/uL (ref 4.22–5.81)
RDW: 12.8 % (ref 11.5–15.5)
WBC: 11.3 10*3/uL — ABNORMAL HIGH (ref 4.0–10.5)
nRBC: 0 % (ref 0.0–0.2)

## 2021-02-28 LAB — URINALYSIS, COMPLETE (UACMP) WITH MICROSCOPIC
Bilirubin Urine: NEGATIVE
Glucose, UA: NEGATIVE mg/dL
Hgb urine dipstick: NEGATIVE
Ketones, ur: 5 mg/dL — AB
Nitrite: NEGATIVE
Protein, ur: 30 mg/dL — AB
Specific Gravity, Urine: 1.027 (ref 1.005–1.030)
pH: 5 (ref 5.0–8.0)

## 2021-02-28 LAB — COMPREHENSIVE METABOLIC PANEL
ALT: 13 U/L (ref 0–44)
AST: 31 U/L (ref 15–41)
Albumin: 4.1 g/dL (ref 3.5–5.0)
Alkaline Phosphatase: 54 U/L (ref 38–126)
Anion gap: 8 (ref 5–15)
BUN: 17 mg/dL (ref 6–20)
CO2: 24 mmol/L (ref 22–32)
Calcium: 9.3 mg/dL (ref 8.9–10.3)
Chloride: 108 mmol/L (ref 98–111)
Creatinine, Ser: 1.51 mg/dL — ABNORMAL HIGH (ref 0.61–1.24)
GFR, Estimated: >60 mL/min (ref >60)
Glucose, Bld: 87 mg/dL (ref 70–99)
Potassium: 3.6 mmol/L (ref 3.5–5.1)
Sodium: 140 mmol/L (ref 135–145)
Total Bilirubin: 0.8 mg/dL (ref 0.3–1.2)
Total Protein: 7.1 g/dL (ref 6.5–8.1)

## 2021-02-28 LAB — URINE DRUG SCREEN, QUALITATIVE (ARMC ONLY)
Amphetamines, Ur Screen: POSITIVE — AB
Barbiturates, Ur Screen: NOT DETECTED
Benzodiazepine, Ur Scrn: NOT DETECTED
Cannabinoid 50 Ng, Ur ~~LOC~~: NOT DETECTED
Cocaine Metabolite,Ur ~~LOC~~: NOT DETECTED
MDMA (Ecstasy)Ur Screen: NOT DETECTED
Methadone Scn, Ur: NOT DETECTED
Opiate, Ur Screen: NOT DETECTED
Phencyclidine (PCP) Ur S: NOT DETECTED
Tricyclic, Ur Screen: NOT DETECTED

## 2021-02-28 LAB — ETHANOL: Alcohol, Ethyl (B): <10 mg/dL (ref <10)

## 2021-02-28 LAB — TROPONIN I (HIGH SENSITIVITY): Troponin I (High Sensitivity): 3 ng/L (ref <18)

## 2021-02-28 LAB — SALICYLATE LEVEL: Salicylate Lvl: <7 mg/dL — ABNORMAL LOW (ref 7.0–30.0)

## 2021-02-28 MED ORDER — SODIUM CHLORIDE 0.9 % IV BOLUS
1000.0000 mL | Freq: Once | INTRAVENOUS | Status: AC
Start: 2021-02-28 — End: 2021-02-28
  Administered 2021-02-28: 1000 mL via INTRAVENOUS

## 2021-02-28 NOTE — ED Triage Notes (Addendum)
Pt presents to the [facility] via EMS in police custody with c/o drug overdose. EMS states that pt was pulled over then ran from police. Pt was caught, arrested, and placed in custody. While en route to jail, pt became unresponsive and apneic. Pt received a total of 6mg  Narcan intranasally before becoming responsive.

## 2021-02-28 NOTE — ED Provider Notes (Signed)
[facility] Emergency Department Provider Note ____________________________________________   Event Date/Time   First MD Initiated Contact with Patient 02/28/21 1408     (approximate)  I have reviewed the triage vital signs and the nursing notes.   HISTORY  Chief Complaint Drug Overdose  HPI Jackson Rodriguez is a 40 y.o. male with history of hepatitis C and methamphetamine abuse presents to the emergency department for treatment and evaluation of potential drug overdose.  Patient arrives in police custody after being pulled over and then running from them.  They caught him and arrested him but while in route to jail he became unresponsive and apneic.  He received a total of 6 mg of Narcan intranasally and then regained responsiveness. Officer reports he told them he had used ICE (crystal meth) and heroin and swallowed some while running.         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 Authorizing Provider  OLANZapine (ZYPREXA) 5 MG tablet Take 1 tablet (5 mg total) by mouth 2 (two) times a day. 03/27/19   Charm Rings, NP    Allergies Patient has no known allergies.  No family history on file.  Social History Social History   Tobacco Use   Smoking status: Every Day    Pack years: 0.00    Types: Cigarettes   Smokeless tobacco: Never  Vaping Use   Vaping Use: Never used  Substance Use Topics   Alcohol use: Not Currently    Comment: 1 beer last night   Drug use: Yes    Types: Marijuana, Cocaine, Methamphetamines    Comment: methamphetamine last used 5/18    Review of Systems  Level 5 caveat altered mental  status ____________________________________________   PHYSICAL EXAM:  VITAL SIGNS: ED Triage Vitals  Enc Vitals Group     BP 02/28/21 1405 116/75     Pulse Rate 02/28/21 1405 (!) 110     Resp 02/28/21 1405 15     Temp 02/28/21 1405 98.8 F (37.1 C)     Temp Source 02/28/21 1405 Oral     SpO2 02/28/21 1405 96 %     Weight 02/28/21 1406 169 lb 12.1 oz (77 kg)     Height 02/28/21 1406 6\' 1"  (1.854 m)     Head Circumference --      Peak Flow --      Pain Score 02/28/21 1406 0     Pain Loc --      Pain Edu? --      Excl. in GC? --     Constitutional: Somnolent.  No acute distress.. Eyes: Conjunctivae are normal.  Pupils 2+ and sluggish Head: Atraumatic. Nose: No congestion/rhinnorhea. Mouth/Throat: Mucous membranes are moist.  Oropharynx non-erythematous. Neck: No stridor.   Hematological/Lymphatic/Immunilogical: No cervical lymphadenopathy. Cardiovascular: Normal rate, regular rhythm. Grossly normal heart sounds.  Good peripheral circulation. Respiratory: Normal respiratory effort.  No retractions. Lungs CTAB. Gastrointestinal: Soft No distention. No abdominal bruits.  Genitourinary:  Musculoskeletal: No lower extremity edema.  No joint effusions. Neurologic:  Normal speech and language. No gross focal neurologic deficits are appreciated. No gait instability. Skin:  Skin is warm, dry and intact.  No rash noted. Psychiatric: Mood and affect are normal. Speech and behavior are normal.  ____________________________________________   LABS (all labs ordered are listed, but only abnormal results are displayed)  Labs Reviewed  COMPREHENSIVE METABOLIC PANEL - Abnormal; Notable for the following components:      Result Value   Creatinine, Ser 1.51 (*)    All other components within normal limits  CBC WITH DIFFERENTIAL/PLATELET - Abnormal; Notable for the following components:   WBC 11.3 (*)    Neutro Abs 8.7 (*)    All other components within normal limits  URINALYSIS,  COMPLETE (UACMP) WITH MICROSCOPIC - Abnormal; Notable for the following components:   Color, Urine YELLOW (*)    APPearance HAZY (*)    Ketones, ur 5 (*)    Protein, ur 30 (*)    Leukocytes,Ua TRACE (*)    Bacteria, UA RARE (*)    All other components within normal limits  URINE DRUG SCREEN, QUALITATIVE ([facility] ONLY) - Abnormal; Notable for the following components:   Amphetamines, Ur Screen POSITIVE (*)    All other components within normal limits  SALICYLATE LEVEL - Abnormal; Notable for the following components:   Salicylate Lvl <7.0 (*)    All other components within normal limits  ETHANOL  TROPONIN I (HIGH SENSITIVITY)   ____________________________________________  EKG  ED ECG REPORT I, Raeven Pint, FNP-BC personally viewed and interpreted this ECG.   Date: 02/28/2021  EKG Time: 1434  Rate: 97  Rhythm: normal EKG, normal sinus rhythm  Axis: normal  Intervals:none  ST&T Change: minimal ST elevation V5&V6  ____________________________________________  RADIOLOGY  ED MD interpretation:    Chest x-ray without acute concerns.  I, Kem Boroughs, personally viewed and evaluated these images (plain radiographs) as part of my medical decision making, as well as reviewing the written report by the radiologist.  Official radiology report(s): DG Chest 1 View  Result Date: 02/28/2021 CLINICAL DATA:  Altered mental status. EXAM: CHEST  1 VIEW COMPARISON:  Prior chest radiograph 02/26/2015. FINDINGS: Heart size within normal limits. No appreciable airspace consolidation. No evidence of pleural effusion or pneumothorax. Please note a portion of the lateral right lower ribs are excluded from the field of view. No acute bony abnormality identified. IMPRESSION: No evidence of active cardiopulmonary disease. Please note a portion of the lateral right lower ribs are excluded from the field of view. Electronically Signed   By: Jackey Loge DO   On: 02/28/2021 14:32     ____________________________________________   PROCEDURES  Procedure(s) performed (including Critical Care):  Procedures  ____________________________________________   INITIAL IMPRESSION / ASSESSMENT AND PLAN     40 year old male presenting to the emergency department in police custody for presumed drug overdose.  See HPI for further details.  We will get some screening labs, give some IV fluids, get a chest x-ray and EKG.  Patient is lethargic but will awaken to voice.  Vital signs are stable.  Respirations are even and unlabored.  DIFFERENTIAL DIAGNOSIS  Intoxication, drug overdose, substance abuse.  ED COURSE  Patient remains lethargic, but will awaken to voice. Nursing staff state that he has been awake and talking. Vital signs are stable. No longer tachycardic. IV fluids infusing. Awaiting urinalysis and UDS.   Patient has been awake and talking with officer in room and other staff. Denies SI/HI. Vital signs remain stable. UDS positive for amphetamines. He has eaten part of a sandwich meal. Stable for discharge.    ___________________________________________   FINAL CLINICAL IMPRESSION(S) /  ED DIAGNOSES  Final diagnoses:  Substance abuse [facility])     ED Discharge Orders     None        Jackson Rodriguez was evaluated in Emergency Department on 02/28/2021 for the symptoms described in the history of present illness. He was evaluated in the context of the global COVID-19 pandemic, which necessitated consideration that the patient might be at risk for infection with the SARS-CoV-2 virus that causes COVID-19. Institutional protocols and algorithms that pertain to the evaluation of patients at risk for COVID-19 are in a state of rapid change based on information released by regulatory bodies including the CDC and federal and state organizations. These policies and algorithms were followed during the patient's care in the ED.   Note:  This document was prepared using  Dragon voice recognition software and may include unintentional dictation errors.    Chinita Pester, FNP 02/28/21 1924    Jene Every, MD 03/03/21 [phone]

## 2021-04-28 ENCOUNTER — Emergency Department: Payer: Medicaid Other

## 2021-04-28 DIAGNOSIS — Y92410 Unspecified street and highway as the place of occurrence of the external cause: Secondary | ICD-10-CM | POA: Insufficient documentation

## 2021-04-28 DIAGNOSIS — F17210 Nicotine dependence, cigarettes, uncomplicated: Secondary | ICD-10-CM | POA: Insufficient documentation

## 2021-04-28 DIAGNOSIS — Z23 Encounter for immunization: Secondary | ICD-10-CM | POA: Insufficient documentation

## 2021-04-28 DIAGNOSIS — S80212A Abrasion, left knee, initial encounter: Secondary | ICD-10-CM | POA: Insufficient documentation
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
## 2021-04-29 ENCOUNTER — Emergency Department
Admission: EM | Admit: 2021-04-29 | Discharge: 2021-04-29 | Disposition: A | Discharge status: Home / Self Care | Payer: Medicaid Other | Attending: Emergency Medicine | Admitting: Emergency Medicine

## 2021-04-29 DIAGNOSIS — M25562 Pain in left knee: Secondary | ICD-10-CM

## 2021-04-29 MED ORDER — IBUPROFEN 800 MG PO TABS
800.0000 mg | ORAL_TABLET | Freq: Once | ORAL | Status: AC
  Administered 2021-04-29: 800 mg via ORAL
  Filled 2021-04-29: qty 1

## 2021-04-29 MED ORDER — TETANUS-DIPHTH-ACELL PERTUSSIS 5-2.5-18.5 LF-MCG/0.5 IM SUSY
0.5000 mL | PREFILLED_SYRINGE | Freq: Once | INTRAMUSCULAR | Status: AC
  Administered 2021-04-29: 0.5 mL via INTRAMUSCULAR
  Filled 2021-04-29: qty 0.5
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
## 2021-04-29 NOTE — Discharge Instructions (Addendum)
You may alternate Tylenol 1000 mg every 6 hours as needed for pain, fever and Ibuprofen 800 mg every 8 hours as needed for pain, fever.  Please take Ibuprofen with food.  Do not take more than 4000 mg of Tylenol (acetaminophen) in a 24 hour period.   Steps to find a Primary Care Provider (PCP):  Call [phone] or [phone] to access "[facility] Find a Doctor Service."  2.  You may also go on the [facility] website at [email or web address]  
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
